# Patient Record
Sex: Female | Born: 1960 | Race: White | Hispanic: No | Smoking: Never smoker
Health system: Southern US, Community
[De-identification: ages and names within clinical notes are randomized; demographics above are authoritative.]

## PROBLEM LIST (undated history)

## (undated) DIAGNOSIS — I10 Essential (primary) hypertension: Secondary | ICD-10-CM

## (undated) DIAGNOSIS — M810 Age-related osteoporosis without current pathological fracture: Secondary | ICD-10-CM

## (undated) HISTORY — DX: Essential (primary) hypertension: I10

## (undated) HISTORY — DX: Age-related osteoporosis without current pathological fracture: M81.0

---

## 1997-05-31 ENCOUNTER — Ambulatory Visit (HOSPITAL_COMMUNITY): Admission: RE | Admit: 1997-05-31 | Discharge: 1997-05-31 | Payer: Self-pay | Admitting: Obstetrics and Gynecology

## 1998-05-22 ENCOUNTER — Other Ambulatory Visit: Admission: RE | Admit: 1998-05-22 | Discharge: 1998-05-22 | Payer: Self-pay | Admitting: Obstetrics and Gynecology

## 1998-06-02 ENCOUNTER — Ambulatory Visit (HOSPITAL_COMMUNITY): Admission: RE | Admit: 1998-06-02 | Discharge: 1998-06-02 | Payer: Self-pay | Admitting: Obstetrics and Gynecology

## 1998-06-02 ENCOUNTER — Encounter: Payer: Self-pay | Admitting: Obstetrics and Gynecology

## 1999-06-12 ENCOUNTER — Ambulatory Visit (HOSPITAL_COMMUNITY): Admission: RE | Admit: 1999-06-12 | Discharge: 1999-06-12 | Payer: Self-pay | Admitting: Obstetrics and Gynecology

## 1999-06-12 ENCOUNTER — Encounter: Payer: Self-pay | Admitting: Obstetrics and Gynecology

## 1999-07-04 ENCOUNTER — Other Ambulatory Visit: Admission: RE | Admit: 1999-07-04 | Discharge: 1999-07-04 | Payer: Self-pay | Admitting: Obstetrics and Gynecology

## 2000-06-30 ENCOUNTER — Encounter: Payer: Self-pay | Admitting: Obstetrics and Gynecology

## 2000-06-30 ENCOUNTER — Encounter: Admission: RE | Admit: 2000-06-30 | Discharge: 2000-06-30 | Payer: Self-pay | Admitting: Obstetrics and Gynecology

## 2000-07-29 ENCOUNTER — Other Ambulatory Visit: Admission: RE | Admit: 2000-07-29 | Discharge: 2000-07-29 | Payer: Self-pay | Admitting: Obstetrics and Gynecology

## 2001-10-26 ENCOUNTER — Other Ambulatory Visit: Admission: RE | Admit: 2001-10-26 | Discharge: 2001-10-26 | Payer: Self-pay | Admitting: Obstetrics and Gynecology

## 2003-02-22 ENCOUNTER — Encounter: Admission: RE | Admit: 2003-02-22 | Discharge: 2003-02-22 | Payer: Self-pay | Admitting: Obstetrics and Gynecology

## 2003-04-19 ENCOUNTER — Other Ambulatory Visit: Admission: RE | Admit: 2003-04-19 | Discharge: 2003-04-19 | Payer: Self-pay | Admitting: Obstetrics and Gynecology

## 2003-06-22 ENCOUNTER — Other Ambulatory Visit: Admission: RE | Admit: 2003-06-22 | Discharge: 2003-06-22 | Payer: Self-pay | Admitting: Obstetrics and Gynecology

## 2004-01-31 ENCOUNTER — Other Ambulatory Visit: Admission: RE | Admit: 2004-01-31 | Discharge: 2004-01-31 | Payer: Self-pay | Admitting: Obstetrics and Gynecology

## 2004-03-14 ENCOUNTER — Encounter: Admission: RE | Admit: 2004-03-14 | Discharge: 2004-03-14 | Payer: Self-pay | Admitting: Obstetrics and Gynecology

## 2004-06-26 ENCOUNTER — Other Ambulatory Visit: Admission: RE | Admit: 2004-06-26 | Discharge: 2004-06-26 | Payer: Self-pay | Admitting: Obstetrics and Gynecology

## 2004-12-03 ENCOUNTER — Other Ambulatory Visit: Admission: RE | Admit: 2004-12-03 | Discharge: 2004-12-03 | Payer: Self-pay | Admitting: Obstetrics and Gynecology

## 2005-03-15 ENCOUNTER — Encounter: Admission: RE | Admit: 2005-03-15 | Discharge: 2005-03-15 | Payer: Self-pay | Admitting: Obstetrics and Gynecology

## 2005-04-03 ENCOUNTER — Encounter: Admission: RE | Admit: 2005-04-03 | Discharge: 2005-04-03 | Payer: Self-pay | Admitting: Obstetrics and Gynecology

## 2005-06-18 ENCOUNTER — Other Ambulatory Visit: Admission: RE | Admit: 2005-06-18 | Discharge: 2005-06-18 | Payer: Self-pay | Admitting: Obstetrics and Gynecology

## 2005-07-01 ENCOUNTER — Encounter: Admission: RE | Admit: 2005-07-01 | Discharge: 2005-07-01 | Payer: Self-pay | Admitting: Obstetrics and Gynecology

## 2005-09-30 ENCOUNTER — Encounter: Admission: RE | Admit: 2005-09-30 | Discharge: 2005-09-30 | Payer: Self-pay | Admitting: Obstetrics and Gynecology

## 2006-03-18 ENCOUNTER — Encounter: Admission: RE | Admit: 2006-03-18 | Discharge: 2006-03-18 | Payer: Self-pay | Admitting: Obstetrics and Gynecology

## 2006-09-09 ENCOUNTER — Encounter: Admission: RE | Admit: 2006-09-09 | Discharge: 2006-09-09 | Payer: Self-pay | Admitting: Obstetrics and Gynecology

## 2007-04-03 ENCOUNTER — Encounter: Admission: RE | Admit: 2007-04-03 | Discharge: 2007-04-03 | Payer: Self-pay | Admitting: Obstetrics and Gynecology

## 2007-04-10 ENCOUNTER — Encounter: Admission: RE | Admit: 2007-04-10 | Discharge: 2007-04-10 | Payer: Self-pay | Admitting: Obstetrics and Gynecology

## 2008-04-07 ENCOUNTER — Encounter: Admission: RE | Admit: 2008-04-07 | Discharge: 2008-04-07 | Payer: Self-pay | Admitting: Obstetrics and Gynecology

## 2009-04-13 ENCOUNTER — Encounter: Admission: RE | Admit: 2009-04-13 | Discharge: 2009-04-13 | Payer: Self-pay | Admitting: Obstetrics and Gynecology

## 2010-03-24 ENCOUNTER — Encounter: Payer: Self-pay | Admitting: Obstetrics and Gynecology

## 2010-04-02 ENCOUNTER — Other Ambulatory Visit: Payer: Self-pay | Admitting: Obstetrics and Gynecology

## 2010-04-02 DIAGNOSIS — Z1239 Encounter for other screening for malignant neoplasm of breast: Secondary | ICD-10-CM

## 2010-04-19 ENCOUNTER — Ambulatory Visit
Admission: RE | Admit: 2010-04-19 | Discharge: 2010-04-19 | Disposition: A | Discharge status: Home / Self Care | Payer: PRIVATE HEALTH INSURANCE | Source: Ambulatory Visit | Attending: Obstetrics and Gynecology | Admitting: Obstetrics and Gynecology

## 2010-04-19 DIAGNOSIS — Z1239 Encounter for other screening for malignant neoplasm of breast: Secondary | ICD-10-CM

## 2011-04-22 ENCOUNTER — Other Ambulatory Visit: Payer: Self-pay | Admitting: Obstetrics and Gynecology

## 2011-04-22 DIAGNOSIS — Z1231 Encounter for screening mammogram for malignant neoplasm of breast: Secondary | ICD-10-CM

## 2011-04-24 ENCOUNTER — Ambulatory Visit
Admission: RE | Admit: 2011-04-24 | Discharge: 2011-04-24 | Disposition: A | Discharge status: Home / Self Care | Payer: BC Managed Care – PPO | Source: Ambulatory Visit | Attending: Obstetrics and Gynecology | Admitting: Obstetrics and Gynecology

## 2011-04-24 DIAGNOSIS — Z1231 Encounter for screening mammogram for malignant neoplasm of breast: Secondary | ICD-10-CM

## 2012-03-17 ENCOUNTER — Other Ambulatory Visit: Payer: Self-pay | Admitting: Obstetrics and Gynecology

## 2012-03-17 DIAGNOSIS — Z1231 Encounter for screening mammogram for malignant neoplasm of breast: Secondary | ICD-10-CM

## 2012-04-29 ENCOUNTER — Ambulatory Visit: Payer: BC Managed Care – PPO

## 2012-06-03 ENCOUNTER — Ambulatory Visit
Admission: RE | Admit: 2012-06-03 | Discharge: 2012-06-03 | Disposition: A | Discharge status: Home / Self Care | Payer: BC Managed Care – PPO | Source: Ambulatory Visit | Attending: Obstetrics and Gynecology | Admitting: Obstetrics and Gynecology

## 2012-06-03 DIAGNOSIS — Z1231 Encounter for screening mammogram for malignant neoplasm of breast: Secondary | ICD-10-CM

## 2013-06-21 ENCOUNTER — Other Ambulatory Visit: Payer: Self-pay

## 2013-06-21 DIAGNOSIS — Z1231 Encounter for screening mammogram for malignant neoplasm of breast: Secondary | ICD-10-CM

## 2013-07-14 ENCOUNTER — Ambulatory Visit
Admission: RE | Admit: 2013-07-14 | Discharge: 2013-07-14 | Disposition: A | Discharge status: Home / Self Care | Payer: BC Managed Care – PPO | Source: Ambulatory Visit

## 2013-07-14 ENCOUNTER — Encounter (INDEPENDENT_AMBULATORY_CARE_PROVIDER_SITE_OTHER): Payer: Self-pay

## 2013-07-14 DIAGNOSIS — Z1231 Encounter for screening mammogram for malignant neoplasm of breast: Secondary | ICD-10-CM

## 2014-04-12 ENCOUNTER — Other Ambulatory Visit: Payer: Self-pay

## 2014-04-12 DIAGNOSIS — Z1231 Encounter for screening mammogram for malignant neoplasm of breast: Secondary | ICD-10-CM

## 2014-07-20 ENCOUNTER — Ambulatory Visit
Admission: RE | Admit: 2014-07-20 | Discharge: 2014-07-20 | Disposition: A | Discharge status: Home / Self Care | Payer: BLUE CROSS/BLUE SHIELD | Source: Ambulatory Visit

## 2014-07-20 ENCOUNTER — Ambulatory Visit: Payer: Self-pay

## 2014-07-20 DIAGNOSIS — Z1231 Encounter for screening mammogram for malignant neoplasm of breast: Secondary | ICD-10-CM

## 2015-07-04 ENCOUNTER — Other Ambulatory Visit: Payer: Self-pay

## 2015-07-04 DIAGNOSIS — Z1231 Encounter for screening mammogram for malignant neoplasm of breast: Secondary | ICD-10-CM

## 2015-07-25 ENCOUNTER — Ambulatory Visit
Admission: RE | Admit: 2015-07-25 | Discharge: 2015-07-25 | Disposition: A | Discharge status: Home / Self Care | Payer: BLUE CROSS/BLUE SHIELD | Source: Ambulatory Visit

## 2015-07-25 DIAGNOSIS — Z1231 Encounter for screening mammogram for malignant neoplasm of breast: Secondary | ICD-10-CM

## 2015-08-16 ENCOUNTER — Ambulatory Visit: Payer: BLUE CROSS/BLUE SHIELD

## 2016-05-10 ENCOUNTER — Other Ambulatory Visit: Payer: Self-pay | Admitting: Obstetrics and Gynecology

## 2016-05-10 DIAGNOSIS — Z1231 Encounter for screening mammogram for malignant neoplasm of breast: Secondary | ICD-10-CM

## 2016-07-25 ENCOUNTER — Ambulatory Visit
Admission: RE | Admit: 2016-07-25 | Discharge: 2016-07-25 | Disposition: A | Discharge status: Home / Self Care | Payer: BLUE CROSS/BLUE SHIELD | Source: Ambulatory Visit | Attending: Obstetrics and Gynecology | Admitting: Obstetrics and Gynecology

## 2016-07-25 DIAGNOSIS — Z1231 Encounter for screening mammogram for malignant neoplasm of breast: Secondary | ICD-10-CM

## 2017-06-12 ENCOUNTER — Other Ambulatory Visit: Payer: Self-pay | Admitting: Obstetrics and Gynecology

## 2017-06-12 DIAGNOSIS — Z139 Encounter for screening, unspecified: Secondary | ICD-10-CM

## 2017-07-16 ENCOUNTER — Other Ambulatory Visit: Payer: Self-pay | Admitting: Obstetrics and Gynecology

## 2017-07-16 DIAGNOSIS — Z6831 Body mass index (BMI) 31.0-31.9, adult: Secondary | ICD-10-CM | POA: Diagnosis not present

## 2017-07-16 DIAGNOSIS — N63 Unspecified lump in unspecified breast: Secondary | ICD-10-CM

## 2017-07-16 DIAGNOSIS — Z01419 Encounter for gynecological examination (general) (routine) without abnormal findings: Secondary | ICD-10-CM | POA: Diagnosis not present

## 2017-07-18 ENCOUNTER — Ambulatory Visit
Admission: RE | Admit: 2017-07-18 | Discharge: 2017-07-18 | Disposition: A | Discharge status: Home / Self Care | Payer: BLUE CROSS/BLUE SHIELD | Source: Ambulatory Visit | Attending: Obstetrics and Gynecology | Admitting: Obstetrics and Gynecology

## 2017-07-18 ENCOUNTER — Other Ambulatory Visit: Payer: Self-pay | Admitting: Obstetrics and Gynecology

## 2017-07-18 DIAGNOSIS — N63 Unspecified lump in unspecified breast: Secondary | ICD-10-CM

## 2017-07-18 DIAGNOSIS — R928 Other abnormal and inconclusive findings on diagnostic imaging of breast: Secondary | ICD-10-CM | POA: Diagnosis not present

## 2017-07-18 DIAGNOSIS — N6489 Other specified disorders of breast: Secondary | ICD-10-CM | POA: Diagnosis not present

## 2017-07-31 ENCOUNTER — Ambulatory Visit: Payer: BLUE CROSS/BLUE SHIELD

## 2018-06-26 ENCOUNTER — Other Ambulatory Visit: Payer: Self-pay | Admitting: Obstetrics and Gynecology

## 2018-06-26 DIAGNOSIS — Z1231 Encounter for screening mammogram for malignant neoplasm of breast: Secondary | ICD-10-CM

## 2018-08-26 ENCOUNTER — Other Ambulatory Visit: Payer: Self-pay

## 2018-08-26 ENCOUNTER — Ambulatory Visit
Admission: RE | Admit: 2018-08-26 | Discharge: 2018-08-26 | Disposition: A | Discharge status: Home / Self Care | Payer: BC Managed Care – PPO | Source: Ambulatory Visit | Attending: Obstetrics and Gynecology | Admitting: Obstetrics and Gynecology

## 2018-08-26 DIAGNOSIS — Z1231 Encounter for screening mammogram for malignant neoplasm of breast: Secondary | ICD-10-CM

## 2018-09-01 DIAGNOSIS — I10 Essential (primary) hypertension: Secondary | ICD-10-CM | POA: Diagnosis not present

## 2018-09-01 DIAGNOSIS — N952 Postmenopausal atrophic vaginitis: Secondary | ICD-10-CM | POA: Diagnosis not present

## 2018-09-01 DIAGNOSIS — Z01419 Encounter for gynecological examination (general) (routine) without abnormal findings: Secondary | ICD-10-CM | POA: Diagnosis not present

## 2018-09-01 DIAGNOSIS — Z01411 Encounter for gynecological examination (general) (routine) with abnormal findings: Secondary | ICD-10-CM | POA: Diagnosis not present

## 2018-09-01 DIAGNOSIS — M810 Age-related osteoporosis without current pathological fracture: Secondary | ICD-10-CM | POA: Diagnosis not present

## 2018-09-02 ENCOUNTER — Telehealth: Payer: Self-pay | Admitting: Obstetrics and Gynecology

## 2018-09-02 NOTE — Telephone Encounter (Signed)
Is she wants me to be PCP, then please set up 63min OV.  However, I am going to be out of town in the near future and that may delay the appointment.  If she needs attention earlier, then another doc may be able to help out (at least to address the BP issue).  Thanks.

## 2018-09-02 NOTE — Telephone Encounter (Signed)
Best number [phone] Pt called wanting to set up new pt appointment with dr Diona Browner or dr Eldridge Scot belton [birth year]  dad Richie Belton  06/03/63     brother Both see dr Damita Dunnings   Mom see dr Thedore Mins belton [birth year]04[birth year]41    Pt has bp concerns She saw dr Gaetano Net yesterday bp was 160/90 She stated this has been like with for a few months  Offered dr cody Tuesday appointment

## 2018-09-03 NOTE — Telephone Encounter (Signed)
I can see in next available appt slot next week. If BP goes above 160/90... should go to urgent care earlier.

## 2018-09-03 NOTE — Telephone Encounter (Signed)
I left msg for pt to call back

## 2018-09-03 NOTE — Telephone Encounter (Signed)
Pt called back and scheduled 7/27

## 2018-09-08 ENCOUNTER — Ambulatory Visit: Payer: BC Managed Care – PPO | Admitting: Family Medicine

## 2018-09-28 ENCOUNTER — Encounter: Payer: Self-pay | Admitting: Family Medicine

## 2018-09-28 ENCOUNTER — Ambulatory Visit: Payer: BC Managed Care – PPO | Admitting: Family Medicine

## 2018-09-28 ENCOUNTER — Other Ambulatory Visit: Payer: Self-pay

## 2018-09-28 VITALS — BP 164/96 | HR 98 | Temp 98.2°F | Ht 62.5 in | Wt 163.1 lb

## 2018-09-28 DIAGNOSIS — Z7189 Other specified counseling: Secondary | ICD-10-CM

## 2018-09-28 DIAGNOSIS — Z0000 Encounter for general adult medical examination without abnormal findings: Secondary | ICD-10-CM

## 2018-09-28 DIAGNOSIS — I10 Essential (primary) hypertension: Secondary | ICD-10-CM

## 2018-09-28 MED ORDER — AMLODIPINE BESYLATE 2.5 MG PO TABS: 2.5000 mg | ORAL_TABLET | Freq: Every day | ORAL | 3 refills | Status: DC

## 2018-09-28 NOTE — Patient Instructions (Addendum)
Keep trying to cut back on salt and keep walking.   Look up to [place] or check [email or web address].   Take care.  Glad to see you.  Ask about a fasting lab appointment.  I would get a flu shot each fall.

## 2018-09-29 ENCOUNTER — Other Ambulatory Visit (INDEPENDENT_AMBULATORY_CARE_PROVIDER_SITE_OTHER): Payer: BC Managed Care – PPO

## 2018-09-29 DIAGNOSIS — I10 Essential (primary) hypertension: Secondary | ICD-10-CM | POA: Diagnosis not present

## 2018-09-29 LAB — COMPREHENSIVE METABOLIC PANEL
ALT: 15 U/L (ref 0–35)
AST: 18 U/L (ref 0–37)
Albumin: 4.5 g/dL (ref 3.5–5.2)
Alkaline Phosphatase: 52 U/L (ref 39–117)
BUN: 15 mg/dL (ref 6–23)
CO2: 30 mEq/L (ref 19–32)
Calcium: 10 mg/dL (ref 8.4–10.5)
Chloride: 102 mEq/L (ref 96–112)
Creatinine, Ser: 0.82 mg/dL (ref 0.40–1.20)
GFR: 71.61 mL/min (ref >60.00)
Glucose, Bld: 94 mg/dL (ref 70–99)
Potassium: 4.1 mEq/L (ref 3.5–5.1)
Sodium: 139 mEq/L (ref 135–145)
Total Bilirubin: 0.7 mg/dL (ref 0.2–1.2)
Total Protein: 7 g/dL (ref 6.0–8.3)

## 2018-09-29 LAB — LIPID PANEL
Cholesterol: 220 mg/dL — ABNORMAL HIGH (ref 0–200)
HDL: 55.5 mg/dL (ref >39.00)
LDL Cholesterol: 129 mg/dL — ABNORMAL HIGH (ref 0–99)
NonHDL: 164.62
Total CHOL/HDL Ratio: 4
Triglycerides: 176 mg/dL — ABNORMAL HIGH (ref 0.0–149.0)
VLDL: 35.2 mg/dL (ref 0.0–40.0)

## 2018-10-01 ENCOUNTER — Encounter: Payer: Self-pay | Admitting: Family Medicine

## 2018-10-01 DIAGNOSIS — I10 Essential (primary) hypertension: Secondary | ICD-10-CM | POA: Insufficient documentation

## 2018-10-01 DIAGNOSIS — Z7189 Other specified counseling: Secondary | ICD-10-CM | POA: Insufficient documentation

## 2018-10-01 DIAGNOSIS — Z0000 Encounter for general adult medical examination without abnormal findings: Secondary | ICD-10-CM | POA: Insufficient documentation

## 2018-10-01 NOTE — Progress Notes (Signed)
New patient.  CPE- See plan.  Routine anticipatory guidance given to patient.  See health maintenance.    Tetanus discussed with patient.  Unclear about previous date.  Encouraged. Flu shot encouraged for the follow-up Shingles shot due at 18. Pneumonia shot due at 20 Living will discussed with patient.  Daughters Mel Almond and Benjie Karvonen equally designated if patient were incapacitated. Mammogram and Pap smear through gynecology. Bone density test through gynecology. Colon cancer screening follow-up through Dr. Earlean Shawl pending.  I will defer.  She agrees.  Hypertension:    No meds.  Blood pressure elevated on multiple checks Chest pain with exertion:no Edema:no Short of breath:no She will return for labs. She is walking for exercise.  Discussed low-salt diet.  PMH and SH reviewed  Meds, vitals, and allergies reviewed.   ROS: Per HPI.  Unless specifically indicated otherwise in HPI, the patient denies:  General: fever. Eyes: acute vision changes ENT: sore throat Cardiovascular: chest pain Respiratory: SOB GI: vomiting GU: dysuria Musculoskeletal: acute back pain Derm: acute rash Neuro: acute motor dysfunction Psych: worsening mood Endocrine: polydipsia Heme: bleeding Allergy: hayfever  GEN: nad, alert and oriented HEENT: ncat NECK: supple w/o LA CV: rrr. PULM: ctab, no inc wob ABD: soft, +bs EXT: no edema SKIN: no acute rash

## 2018-10-01 NOTE — Assessment & Plan Note (Signed)
Living will discussed with patient.  Daughters Bailey and Blair equally designated if patient were incapacitated. 

## 2018-10-01 NOTE — Assessment & Plan Note (Addendum)
Tetanus discussed with patient.  Unclear about previous date.  Encouraged. Flu shot encouraged for the follow-up Shingles shot due at 29. Pneumonia shot due at 45 Living will discussed with patient.  Daughters Mel Almond and Benjie Karvonen equally designated if patient were incapacitated. Mammogram and Pap smear through gynecology. Bone density test through gynecology. Colon cancer screening follow-up through Dr. Earlean Shawl pending.  I will defer.  She agrees.

## 2018-10-01 NOTE — Assessment & Plan Note (Signed)
Discussed diet and exercise.  Start amlodipine 2.5 mg a day.  Return for follow-up labs.  We may need to adjust her blood pressure medication in the future.  She can update me about her pressure.  Routine cautions given.  She agrees.

## 2018-10-08 ENCOUNTER — Other Ambulatory Visit: Payer: Self-pay | Admitting: Family Medicine

## 2018-10-08 DIAGNOSIS — E785 Hyperlipidemia, unspecified: Secondary | ICD-10-CM

## 2018-10-08 DIAGNOSIS — I10 Essential (primary) hypertension: Secondary | ICD-10-CM

## 2018-11-12 LAB — HM COLONOSCOPY

## 2018-11-19 DIAGNOSIS — Z1211 Encounter for screening for malignant neoplasm of colon: Secondary | ICD-10-CM | POA: Diagnosis not present

## 2018-12-10 ENCOUNTER — Telehealth: Payer: Self-pay

## 2018-12-10 NOTE — Telephone Encounter (Signed)
Called pt in regards to lab work (screening for HCV and HIV), which pt refused.  I also informed pt of immunizations due, (Flu & TDap).  Pt stated that she will call the office to schedule a nurse visit.

## 2018-12-11 NOTE — Telephone Encounter (Signed)
Noted. Thanks.

## 2019-04-13 ENCOUNTER — Other Ambulatory Visit: Payer: BC Managed Care – PPO

## 2019-05-12 ENCOUNTER — Other Ambulatory Visit: Payer: BC Managed Care – PPO

## 2019-07-07 ENCOUNTER — Other Ambulatory Visit: Payer: Self-pay | Admitting: Obstetrics and Gynecology

## 2019-07-07 ENCOUNTER — Other Ambulatory Visit (INDEPENDENT_AMBULATORY_CARE_PROVIDER_SITE_OTHER): Payer: BC Managed Care – PPO

## 2019-07-07 ENCOUNTER — Other Ambulatory Visit: Payer: Self-pay

## 2019-07-07 DIAGNOSIS — Z1231 Encounter for screening mammogram for malignant neoplasm of breast: Secondary | ICD-10-CM

## 2019-07-07 DIAGNOSIS — I10 Essential (primary) hypertension: Secondary | ICD-10-CM | POA: Diagnosis not present

## 2019-07-07 LAB — LIPID PANEL
Cholesterol: 212 mg/dL — ABNORMAL HIGH (ref 0–200)
HDL: 53.6 mg/dL (ref >39.00)
LDL Cholesterol: 122 mg/dL — ABNORMAL HIGH (ref 0–99)
NonHDL: 157.9
Total CHOL/HDL Ratio: 4
Triglycerides: 179 mg/dL — ABNORMAL HIGH (ref 0.0–149.0)
VLDL: 35.8 mg/dL (ref 0.0–40.0)

## 2019-09-01 ENCOUNTER — Other Ambulatory Visit: Payer: Self-pay

## 2019-09-01 ENCOUNTER — Ambulatory Visit
Admission: RE | Admit: 2019-09-01 | Discharge: 2019-09-01 | Disposition: A | Discharge status: Home / Self Care | Payer: BC Managed Care – PPO | Source: Ambulatory Visit | Attending: Obstetrics and Gynecology | Admitting: Obstetrics and Gynecology

## 2019-09-01 DIAGNOSIS — Z1231 Encounter for screening mammogram for malignant neoplasm of breast: Secondary | ICD-10-CM

## 2019-09-11 ENCOUNTER — Other Ambulatory Visit: Payer: Self-pay | Admitting: Family Medicine

## 2019-09-15 DIAGNOSIS — Z01419 Encounter for gynecological examination (general) (routine) without abnormal findings: Secondary | ICD-10-CM | POA: Diagnosis not present

## 2019-09-15 DIAGNOSIS — Z6830 Body mass index (BMI) 30.0-30.9, adult: Secondary | ICD-10-CM | POA: Diagnosis not present

## 2019-09-15 LAB — HM PAP SMEAR

## 2019-10-07 ENCOUNTER — Encounter: Payer: BC Managed Care – PPO | Admitting: Family Medicine

## 2019-10-12 ENCOUNTER — Telehealth: Payer: Self-pay

## 2019-10-12 NOTE — Telephone Encounter (Signed)
Pt needs a referral to [facility] for a DEXA. They told her she needed referral before she could schedule.

## 2019-10-14 NOTE — Telephone Encounter (Signed)
I do not see where I will talk to the patient recently about this.  I thought it was previously handled by gynecology.  Are they addressing this?  Has she contacted gynecology?

## 2019-10-14 NOTE — Telephone Encounter (Signed)
Patient says she does not like her GYN office and prefers not to have the DXA done there.  She prefers to go to [facility] because they do her mammograms.  However, patient does not know if the GYN office set up her DXA or not and will check with them first and will call back if that doesn't work out.

## 2019-10-15 ENCOUNTER — Other Ambulatory Visit: Payer: Self-pay | Admitting: Obstetrics and Gynecology

## 2019-10-15 DIAGNOSIS — M810 Age-related osteoporosis without current pathological fracture: Secondary | ICD-10-CM

## 2019-10-19 ENCOUNTER — Encounter: Payer: BC Managed Care – PPO | Admitting: Family Medicine

## 2019-10-26 ENCOUNTER — Ambulatory Visit (INDEPENDENT_AMBULATORY_CARE_PROVIDER_SITE_OTHER): Payer: BC Managed Care – PPO | Admitting: Family Medicine

## 2019-10-26 ENCOUNTER — Encounter: Payer: Self-pay | Admitting: Family Medicine

## 2019-10-26 ENCOUNTER — Other Ambulatory Visit: Payer: Self-pay

## 2019-10-26 VITALS — BP 120/78 | HR 82 | Temp 97.3°F | Ht 62.5 in | Wt 165.2 lb

## 2019-10-26 DIAGNOSIS — I10 Essential (primary) hypertension: Secondary | ICD-10-CM

## 2019-10-26 DIAGNOSIS — Z0000 Encounter for general adult medical examination without abnormal findings: Secondary | ICD-10-CM | POA: Diagnosis not present

## 2019-10-26 DIAGNOSIS — Z7189 Other specified counseling: Secondary | ICD-10-CM

## 2019-10-26 MED ORDER — AMLODIPINE BESYLATE 2.5 MG PO TABS: 2.5000 mg | ORAL_TABLET | Freq: Every day | ORAL | 3 refills | Status: DC

## 2019-10-26 NOTE — Progress Notes (Signed)
This visit occurred during the SARS-CoV-2 public health emergency.  Safety protocols were in place, including screening questions prior to the visit, additional usage of staff PPE, and extensive cleaning of exam room while observing appropriate contact time as indicated for disinfecting solutions.  CPE- See plan.  Routine anticipatory guidance given to patient.  See health maintenance.  The possibility exists that previously documented standard health maintenance information may have been brought forward from a previous encounter into this note.  If needed, that same information has been updated to reflect the current situation based on today's encounter.    Hypertension:    Using medication without problems or lightheadedness: yes Chest pain with exertion:no Edema:no Short of breath:no Average home BPs:110s/70s at home.   She is walking for exercise.    Tetanus discussed with patient.  Unclear about previous date.  Encouraged. Flu shot encouraged for the fall  covid vaccine d/w pt. encouraged. Shingles shot due at 60. Pneumonia shot due at 81 Living will discussed with patient.  Daughters Fredric Mare and Carlena Sax equally designated if patient were incapacitated. Mammogram and Pap smear through gynecology. Bone density test through gynecology. Colon cancer screening through Dr. Kinnie Scales 2020  PMH and SH reviewed  Meds, vitals, and allergies reviewed.   ROS: Per HPI.  Unless specifically indicated otherwise in HPI, the patient denies:  General: fever. Eyes: acute vision changes ENT: sore throat Cardiovascular: chest pain Respiratory: SOB GI: vomiting GU: dysuria Musculoskeletal: acute back pain Derm: acute rash Neuro: acute motor dysfunction Psych: worsening mood Endocrine: polydipsia Heme: bleeding Allergy: hayfever  GEN: nad, alert and oriented HEENT: ncat NECK: supple w/o LA CV: rrr. PULM: ctab, no inc wob ABD: soft, +bs EXT: no edema SKIN: no acute rash  Recheck BP  120/78 Initial 144/90  The 10-year ASCVD risk score Denman George DC Jr., et al., 2013) is: 3.8%   Values used to calculate the score:     Age: 59 years     Sex: Female     Is Non-Hispanic African American: No     Diabetic: No     Tobacco smoker: No     Systolic Blood Pressure: 120 mmHg     Is BP treated: Yes     HDL Cholesterol: 53.6 mg/dL     Total Cholesterol: 212 mg/dL

## 2019-10-26 NOTE — Patient Instructions (Signed)
Keep working on diet and exercise.  Continue amlodipine.  Update me as needed.  Recheck next year.  Take care.  Glad to see you.

## 2019-10-31 NOTE — Assessment & Plan Note (Addendum)
Controlled.  Continue work on diet and exercise.  Continue amlodipine.  Labs discussed with patient.

## 2019-10-31 NOTE — Assessment & Plan Note (Signed)
Living will discussed with patient.  Daughters Bailey and Blair equally designated if patient were incapacitated. 

## 2019-10-31 NOTE — Assessment & Plan Note (Signed)
Tetanus discussed with patient.  Unclear about previous date.  Encouraged. Flu shot encouraged for the fall  covid vaccine d/w pt. encouraged. Shingles shot due at 60. Pneumonia shot due at 29 Living will discussed with patient.  Daughters Fredric Mare and Carlena Sax equally designated if patient were incapacitated. Mammogram and Pap smear through gynecology. Bone density test through gynecology. Colon cancer screening through Dr. Kinnie Scales 2020

## 2020-02-04 ENCOUNTER — Other Ambulatory Visit: Payer: BC Managed Care – PPO

## 2020-05-09 ENCOUNTER — Other Ambulatory Visit: Payer: Self-pay | Admitting: Obstetrics and Gynecology

## 2020-05-09 DIAGNOSIS — M810 Age-related osteoporosis without current pathological fracture: Secondary | ICD-10-CM

## 2020-05-11 ENCOUNTER — Other Ambulatory Visit: Payer: BC Managed Care – PPO

## 2020-06-06 DIAGNOSIS — D2271 Melanocytic nevi of right lower limb, including hip: Secondary | ICD-10-CM | POA: Diagnosis not present

## 2020-06-06 DIAGNOSIS — D2262 Melanocytic nevi of left upper limb, including shoulder: Secondary | ICD-10-CM | POA: Diagnosis not present

## 2020-06-06 DIAGNOSIS — D2261 Melanocytic nevi of right upper limb, including shoulder: Secondary | ICD-10-CM | POA: Diagnosis not present

## 2020-06-06 DIAGNOSIS — D225 Melanocytic nevi of trunk: Secondary | ICD-10-CM | POA: Diagnosis not present

## 2020-07-12 ENCOUNTER — Other Ambulatory Visit: Payer: Self-pay | Admitting: Obstetrics and Gynecology

## 2020-07-12 DIAGNOSIS — Z1231 Encounter for screening mammogram for malignant neoplasm of breast: Secondary | ICD-10-CM

## 2020-09-07 ENCOUNTER — Other Ambulatory Visit: Payer: Self-pay

## 2020-09-07 ENCOUNTER — Ambulatory Visit
Admission: RE | Admit: 2020-09-07 | Discharge: 2020-09-07 | Disposition: A | Discharge status: Home / Self Care | Payer: BC Managed Care – PPO | Source: Ambulatory Visit | Attending: Obstetrics and Gynecology | Admitting: Obstetrics and Gynecology

## 2020-09-07 DIAGNOSIS — Z1231 Encounter for screening mammogram for malignant neoplasm of breast: Secondary | ICD-10-CM | POA: Diagnosis not present

## 2020-09-22 DIAGNOSIS — Z01419 Encounter for gynecological examination (general) (routine) without abnormal findings: Secondary | ICD-10-CM | POA: Diagnosis not present

## 2020-09-22 DIAGNOSIS — M810 Age-related osteoporosis without current pathological fracture: Secondary | ICD-10-CM | POA: Diagnosis not present

## 2020-09-22 DIAGNOSIS — Z6830 Body mass index (BMI) 30.0-30.9, adult: Secondary | ICD-10-CM | POA: Diagnosis not present

## 2020-09-22 DIAGNOSIS — N952 Postmenopausal atrophic vaginitis: Secondary | ICD-10-CM | POA: Diagnosis not present

## 2020-10-24 ENCOUNTER — Ambulatory Visit
Admission: RE | Admit: 2020-10-24 | Discharge: 2020-10-24 | Disposition: A | Discharge status: Home / Self Care | Payer: BC Managed Care – PPO | Source: Ambulatory Visit | Attending: Obstetrics and Gynecology | Admitting: Obstetrics and Gynecology

## 2020-10-24 ENCOUNTER — Other Ambulatory Visit: Payer: Self-pay

## 2020-10-24 DIAGNOSIS — M810 Age-related osteoporosis without current pathological fracture: Secondary | ICD-10-CM

## 2020-10-24 DIAGNOSIS — M8589 Other specified disorders of bone density and structure, multiple sites: Secondary | ICD-10-CM | POA: Diagnosis not present

## 2020-10-24 DIAGNOSIS — Z780 Asymptomatic menopausal state: Secondary | ICD-10-CM | POA: Diagnosis not present

## 2020-10-25 ENCOUNTER — Encounter: Payer: Self-pay | Admitting: Family Medicine

## 2020-10-25 DIAGNOSIS — M810 Age-related osteoporosis without current pathological fracture: Secondary | ICD-10-CM | POA: Insufficient documentation

## 2020-11-03 ENCOUNTER — Encounter: Payer: BC Managed Care – PPO | Admitting: Family Medicine

## 2020-11-09 ENCOUNTER — Other Ambulatory Visit (INDEPENDENT_AMBULATORY_CARE_PROVIDER_SITE_OTHER): Payer: BC Managed Care – PPO

## 2020-11-09 ENCOUNTER — Other Ambulatory Visit: Payer: Self-pay

## 2020-11-09 ENCOUNTER — Other Ambulatory Visit: Payer: Self-pay | Admitting: Family Medicine

## 2020-11-09 DIAGNOSIS — M810 Age-related osteoporosis without current pathological fracture: Secondary | ICD-10-CM | POA: Diagnosis not present

## 2020-11-09 DIAGNOSIS — I10 Essential (primary) hypertension: Secondary | ICD-10-CM

## 2020-11-09 LAB — COMPREHENSIVE METABOLIC PANEL
ALT: 16 U/L (ref 0–35)
AST: 18 U/L (ref 0–37)
Albumin: 4.3 g/dL (ref 3.5–5.2)
Alkaline Phosphatase: 47 U/L (ref 39–117)
BUN: 14 mg/dL (ref 6–23)
CO2: 29 mEq/L (ref 19–32)
Calcium: 10 mg/dL (ref 8.4–10.5)
Chloride: 103 mEq/L (ref 96–112)
Creatinine, Ser: 0.8 mg/dL (ref 0.40–1.20)
GFR: 80.28 mL/min (ref >60.00)
Glucose, Bld: 84 mg/dL (ref 70–99)
Potassium: 4.1 mEq/L (ref 3.5–5.1)
Sodium: 141 mEq/L (ref 135–145)
Total Bilirubin: 0.6 mg/dL (ref 0.2–1.2)
Total Protein: 6.9 g/dL (ref 6.0–8.3)

## 2020-11-09 LAB — LIPID PANEL
Cholesterol: 215 mg/dL — ABNORMAL HIGH (ref 0–200)
HDL: 50 mg/dL (ref >39.00)
NonHDL: 164.98
Total CHOL/HDL Ratio: 4
Triglycerides: 228 mg/dL — ABNORMAL HIGH (ref 0.0–149.0)
VLDL: 45.6 mg/dL — ABNORMAL HIGH (ref 0.0–40.0)

## 2020-11-09 LAB — LDL CHOLESTEROL, DIRECT: Direct LDL: 134 mg/dL

## 2020-11-09 LAB — VITAMIN D 25 HYDROXY (VIT D DEFICIENCY, FRACTURES): VITD: 44.42 ng/mL (ref 30.00–100.00)

## 2020-11-13 ENCOUNTER — Ambulatory Visit (INDEPENDENT_AMBULATORY_CARE_PROVIDER_SITE_OTHER): Payer: BC Managed Care – PPO | Admitting: Family Medicine

## 2020-11-13 ENCOUNTER — Other Ambulatory Visit: Payer: Self-pay

## 2020-11-13 ENCOUNTER — Encounter: Payer: Self-pay | Admitting: Family Medicine

## 2020-11-13 VITALS — BP 164/98 | HR 93 | Temp 98.0°F | Ht 62.5 in | Wt 163.0 lb

## 2020-11-13 DIAGNOSIS — I10 Essential (primary) hypertension: Secondary | ICD-10-CM

## 2020-11-13 DIAGNOSIS — Z7189 Other specified counseling: Secondary | ICD-10-CM

## 2020-11-13 DIAGNOSIS — Z0000 Encounter for general adult medical examination without abnormal findings: Secondary | ICD-10-CM

## 2020-11-13 DIAGNOSIS — M810 Age-related osteoporosis without current pathological fracture: Secondary | ICD-10-CM

## 2020-11-13 MED ORDER — AMLODIPINE BESYLATE 2.5 MG PO TABS: 2.5000 mg | ORAL_TABLET | Freq: Every day | ORAL | 3 refills | Status: DC

## 2020-11-13 NOTE — Patient Instructions (Addendum)
Keep taking 1000 units vitamin D a day and 1200mg  of calcium a day.   I would start taking fosamax/alendronate- please consider that.   Weight bearing exercise.   I would get vaccinated this fall.  Take care.  Glad to see you.

## 2020-11-13 NOTE — Progress Notes (Signed)
This visit occurred during the SARS-CoV-2 public health emergency.  Safety protocols were in place, including screening questions prior to the visit, additional usage of staff PPE, and extensive cleaning of exam room while observing appropriate contact time as indicated for disinfecting solutions.  CPE- See plan.  Routine anticipatory guidance given to patient.  See health maintenance.  The possibility exists that previously documented standard health maintenance information may have been brought forward from a previous encounter into this note.  If needed, that same information has been updated to reflect the current situation based on today's encounter.    Tetanus discussed with patient.  Unclear about previous date.  Encouraged. Flu shot encouraged for the fall  covid vaccine d/w pt. encouraged. Shingles shot due at 60. Pneumonia shot due at 79 Living will discussed with patient.  Daughters Fredric Mare and Carlena Sax equally designated if patient were incapacitated. Mammogram and Pap smear through gynecology. Bone density test through gynecology. Colon cancer screening through Dr. Kinnie Scales 2020  Osteoporosis.  T score -4.  Vit D wnl.  She was worried about her results d/w pt.  See plan.   Hypertension:    Using medication without problems or lightheadedness: yes Chest pain with exertion:no Edema:no Short of breath:no Average home BPs: usually 120/80s or better on home check.    PMH and SH reviewed  Meds, vitals, and allergies reviewed.   ROS: Per HPI.  Unless specifically indicated otherwise in HPI, the patient denies:  General: fever. Eyes: acute vision changes ENT: sore throat Cardiovascular: chest pain Respiratory: SOB GI: vomiting GU: dysuria Musculoskeletal: acute back pain Derm: acute rash Neuro: acute motor dysfunction Psych: worsening mood Endocrine: polydipsia Heme: bleeding Allergy: hayfever  GEN: nad, alert and oriented HEENT: ncat NECK: supple w/o LA CV: rrr. PULM:  ctab, no inc wob ABD: soft, +bs EXT: no edema SKIN: well perfused.

## 2020-11-15 NOTE — Assessment & Plan Note (Signed)
Continue amlodipine.  Average home BPs: usually 120/80s or better on home check.

## 2020-11-15 NOTE — Assessment & Plan Note (Signed)
D/w pt about DXA results and osteoporosis path/phys in general, including vit D and calcium.  Reasonable to treat with bisphosphonate, ie fosamax.  D/w pt about risk benefit, especially GI sx, jaw and long bone pathology.   She'll consider and update me as needed.    D/w pt that fosamax is a reasonable first line agent and to continue calcium and vit D and weight bearing exercise.  D/w pt that I have no expectation of her improving her bone density w/o rx treatment.  Also discussed that it would take years of treatment with fosamax (if tolerated) to see benefit but she could go ahead with routine vaccination as recommended and benefit from that sooner rather than later.  She will consider.

## 2020-11-15 NOTE — Assessment & Plan Note (Signed)
Living will discussed with patient.  Daughters Bailey and Blair equally designated if patient were incapacitated. 

## 2020-11-15 NOTE — Assessment & Plan Note (Signed)
Tetanus discussed with patient.  Unclear about previous date.  Encouraged. Flu shot encouraged for the fall  covid vaccine d/w pt. encouraged. Shingles shot due at 60. Pneumonia shot due at 7 Living will discussed with patient.  Daughters Fredric Mare and Carlena Sax equally designated if patient were incapacitated. Mammogram and Pap smear through gynecology. Bone density test through gynecology. Colon cancer screening through Dr. Kinnie Scales 2020

## 2020-11-22 ENCOUNTER — Telehealth: Payer: Self-pay | Admitting: Family Medicine

## 2020-11-22 DIAGNOSIS — M810 Age-related osteoporosis without current pathological fracture: Secondary | ICD-10-CM

## 2020-11-22 NOTE — Telephone Encounter (Signed)
I put in the referral and I'll defer to her.  Please explain to her that there are many docs (including me) who could address this locally.

## 2020-11-22 NOTE — Telephone Encounter (Signed)
Pt called in requesting a referral to [facility] Fax [phone]. World like a call back [phone]

## 2020-11-22 NOTE — Telephone Encounter (Signed)
Spoke to pt. She would like to go to endo for Osteoporosis. Wants to discuss the medications available.

## 2020-11-23 NOTE — Telephone Encounter (Signed)
Patient notified referral was done and advised patient that this could be tx by PCP or someone else more locally but this was patients preference.

## 2020-12-05 NOTE — Telephone Encounter (Addendum)
Pt called stating that they haven't received the referral and would like for it to be resend today if possible. If any question call [phone] opt 3

## 2020-12-07 NOTE — Telephone Encounter (Signed)
Faxed to [phone]    [id number]  Caryl Bis on 12/07/2020 [id number] - delivered at 12/07/2020 0806  Faxed again and I have made pt aware of this//ELEA

## 2021-01-11 ENCOUNTER — Telehealth: Payer: Self-pay | Admitting: Family Medicine

## 2021-01-11 NOTE — Telephone Encounter (Signed)
Opened in error

## 2021-06-13 DIAGNOSIS — D225 Melanocytic nevi of trunk: Secondary | ICD-10-CM | POA: Diagnosis not present

## 2021-06-13 DIAGNOSIS — D2262 Melanocytic nevi of left upper limb, including shoulder: Secondary | ICD-10-CM | POA: Diagnosis not present

## 2021-06-13 DIAGNOSIS — D2272 Melanocytic nevi of left lower limb, including hip: Secondary | ICD-10-CM | POA: Diagnosis not present

## 2021-06-13 DIAGNOSIS — D2261 Melanocytic nevi of right upper limb, including shoulder: Secondary | ICD-10-CM | POA: Diagnosis not present

## 2021-07-03 DIAGNOSIS — M810 Age-related osteoporosis without current pathological fracture: Secondary | ICD-10-CM | POA: Diagnosis not present

## 2021-07-03 DIAGNOSIS — Z780 Asymptomatic menopausal state: Secondary | ICD-10-CM | POA: Diagnosis not present

## 2021-07-03 DIAGNOSIS — Z8262 Family history of osteoporosis: Secondary | ICD-10-CM | POA: Diagnosis not present

## 2021-07-03 DIAGNOSIS — M8589 Other specified disorders of bone density and structure, multiple sites: Secondary | ICD-10-CM | POA: Diagnosis not present

## 2021-08-03 ENCOUNTER — Other Ambulatory Visit: Payer: Self-pay | Admitting: Obstetrics and Gynecology

## 2021-08-03 DIAGNOSIS — Z1231 Encounter for screening mammogram for malignant neoplasm of breast: Secondary | ICD-10-CM

## 2021-09-11 ENCOUNTER — Ambulatory Visit
Admission: RE | Admit: 2021-09-11 | Discharge: 2021-09-11 | Disposition: A | Discharge status: Home / Self Care | Payer: BC Managed Care – PPO | Source: Ambulatory Visit | Attending: Obstetrics and Gynecology | Admitting: Obstetrics and Gynecology

## 2021-09-11 DIAGNOSIS — Z1231 Encounter for screening mammogram for malignant neoplasm of breast: Secondary | ICD-10-CM | POA: Diagnosis not present

## 2021-11-15 ENCOUNTER — Telehealth: Payer: Self-pay | Admitting: Family Medicine

## 2021-11-15 MED ORDER — AMLODIPINE BESYLATE 2.5 MG PO TABS: 2.5000 mg | ORAL_TABLET | Freq: Every day | ORAL | 3 refills | Status: DC

## 2021-11-15 NOTE — Telephone Encounter (Signed)
Refill request for Amlodipine 2.5 mg tabs  LOV - 11/13/20 Next OV - 12/04/21 Last refill - 11/13/20 #90/3

## 2021-11-15 NOTE — Telephone Encounter (Signed)
Sent. Thanks.   

## 2021-11-15 NOTE — Telephone Encounter (Signed)
  Encourage patient to contact the pharmacy for refills or they can request refills through [facility]  LAST APPOINTMENT DATE:  Please schedule appointment if longer than 1 year  NEXT APPOINTMENT DATE:  MEDICATION:amLODipine (NORVASC) 2.5 MG tablet   Is the patient out of medication?   [facility] [phone] - [place] [address],   Let patient know to contact pharmacy at the end of the day to make sure medication is ready.  Please notify patient to allow 48-72 hours to process  CLINICAL FILLS OUT ALL BELOW:   LAST REFILL:  QTY:  REFILL DATE:    OTHER COMMENTS:    Okay for refill?  Please advise

## 2021-11-25 ENCOUNTER — Other Ambulatory Visit: Payer: Self-pay | Admitting: Family Medicine

## 2021-11-25 DIAGNOSIS — I10 Essential (primary) hypertension: Secondary | ICD-10-CM

## 2021-11-25 DIAGNOSIS — M810 Age-related osteoporosis without current pathological fracture: Secondary | ICD-10-CM

## 2021-11-30 ENCOUNTER — Other Ambulatory Visit: Payer: BC Managed Care – PPO

## 2021-12-04 ENCOUNTER — Encounter: Payer: BC Managed Care – PPO | Admitting: Family Medicine

## 2021-12-06 ENCOUNTER — Other Ambulatory Visit (INDEPENDENT_AMBULATORY_CARE_PROVIDER_SITE_OTHER): Payer: BC Managed Care – PPO

## 2021-12-06 DIAGNOSIS — I10 Essential (primary) hypertension: Secondary | ICD-10-CM

## 2021-12-06 DIAGNOSIS — M810 Age-related osteoporosis without current pathological fracture: Secondary | ICD-10-CM | POA: Diagnosis not present

## 2021-12-06 LAB — COMPREHENSIVE METABOLIC PANEL
ALT: 15 U/L (ref 0–35)
AST: 18 U/L (ref 0–37)
Albumin: 4.3 g/dL (ref 3.5–5.2)
Alkaline Phosphatase: 43 U/L (ref 39–117)
BUN: 12 mg/dL (ref 6–23)
CO2: 29 mEq/L (ref 19–32)
Calcium: 9.8 mg/dL (ref 8.4–10.5)
Chloride: 104 mEq/L (ref 96–112)
Creatinine, Ser: 0.84 mg/dL (ref 0.40–1.20)
GFR: 75.15 mL/min (ref >60.00)
Glucose, Bld: 90 mg/dL (ref 70–99)
Potassium: 4 mEq/L (ref 3.5–5.1)
Sodium: 141 mEq/L (ref 135–145)
Total Bilirubin: 0.5 mg/dL (ref 0.2–1.2)
Total Protein: 7.2 g/dL (ref 6.0–8.3)

## 2021-12-06 LAB — VITAMIN D 25 HYDROXY (VIT D DEFICIENCY, FRACTURES): VITD: 59.66 ng/mL (ref 30.00–100.00)

## 2021-12-06 LAB — LIPID PANEL
Cholesterol: 195 mg/dL (ref 0–200)
HDL: 53.7 mg/dL (ref >39.00)
LDL Cholesterol: 104 mg/dL — ABNORMAL HIGH (ref 0–99)
NonHDL: 141.09
Total CHOL/HDL Ratio: 4
Triglycerides: 186 mg/dL — ABNORMAL HIGH (ref 0.0–149.0)
VLDL: 37.2 mg/dL (ref 0.0–40.0)

## 2021-12-11 ENCOUNTER — Ambulatory Visit (INDEPENDENT_AMBULATORY_CARE_PROVIDER_SITE_OTHER): Payer: BC Managed Care – PPO | Admitting: Family Medicine

## 2021-12-11 ENCOUNTER — Encounter: Payer: Self-pay | Admitting: Family Medicine

## 2021-12-11 VITALS — BP 132/80 | HR 84 | Temp 97.9°F | Ht 61.5 in | Wt 167.0 lb

## 2021-12-11 DIAGNOSIS — Z0000 Encounter for general adult medical examination without abnormal findings: Secondary | ICD-10-CM | POA: Diagnosis not present

## 2021-12-11 DIAGNOSIS — I10 Essential (primary) hypertension: Secondary | ICD-10-CM

## 2021-12-11 DIAGNOSIS — Z7189 Other specified counseling: Secondary | ICD-10-CM

## 2021-12-11 DIAGNOSIS — M810 Age-related osteoporosis without current pathological fracture: Secondary | ICD-10-CM

## 2021-12-11 NOTE — Patient Instructions (Addendum)
If you can't get scheduled for the bone density test then let me know.   Take care.  Glad to see you. I would get a flu shot each fall.   Update me as needed.

## 2021-12-11 NOTE — Progress Notes (Unsigned)
CPE- See plan.  Routine anticipatory guidance given to patient.  See health maintenance.  The possibility exists that previously documented standard health maintenance information may have been brought forward from a previous encounter into this note.  If needed, that same information has been updated to reflect the current situation based on today's encounter.    Tetanus discussed with patient.  Unclear about previous date.  Encouraged. Flu shot encouraged for the fall  covid vaccine d/w pt. encouraged. Shingles shot due at 60. Pneumonia shot due at 33 Living will discussed with patient.  Daughters Mel Almond and Benjie Karvonen equally designated if patient were incapacitated. Mammogram and Pap smear through gynecology. Bone density test ordered 2023.   Colon cancer screening through Dr. Earlean Shawl 2020.  Osteoporosis.  She saw [facility].  D/w pt.  She opted for    Hypertension:    Using medication without problems or lightheadedness: yes Chest pain with exertion:no Edema:no Short of breath:no Labs d/w pt.   Usually 110s/70s.   PMH and SH reviewed  Meds, vitals, and allergies reviewed.   ROS: Per HPI.  Unless specifically indicated otherwise in HPI, the patient denies:  General: fever. Eyes: acute vision changes ENT: sore throat Cardiovascular: chest pain Respiratory: SOB GI: vomiting GU: dysuria Musculoskeletal: acute back pain Derm: acute rash Neuro: acute motor dysfunction Psych: worsening mood Endocrine: polydipsia Heme: bleeding Allergy: hayfever  GEN: nad, alert and oriented HEENT: mucous membranes moist NECK: supple w/o LA CV: rrr. PULM: ctab, no inc wob ABD: soft, +bs EXT: no edema SKIN: no acute rash

## 2021-12-12 NOTE — Assessment & Plan Note (Signed)
Living will discussed with patient.  Daughters Mel Almond and Benjie Karvonen equally designated if patient were incapacitated.

## 2021-12-12 NOTE — Assessment & Plan Note (Signed)
Tetanus discussed with patient.  Unclear about previous date.  Encouraged. Flu shot encouraged for the fall  covid vaccine d/w pt. encouraged. Shingles shot d/w pt.   Pneumonia shot due at 34 Living will discussed with patient.  Daughters Mel Almond and Benjie Karvonen equally designated if patient were incapacitated. Mammogram and Pap smear through gynecology. Bone density test ordered 2023.   Colon cancer screening through Dr. Earlean Shawl 2020.

## 2021-12-12 NOTE — Assessment & Plan Note (Signed)
Osteoporosis.  She saw [facility].  D/w pt.  She opted for increased calcium intake and recheck DEXA scan.  Ordered.

## 2021-12-12 NOTE — Assessment & Plan Note (Signed)
Continue work on diet and exercise.  Continue amlodipine.

## 2021-12-19 DIAGNOSIS — Z6830 Body mass index (BMI) 30.0-30.9, adult: Secondary | ICD-10-CM | POA: Diagnosis not present

## 2021-12-19 DIAGNOSIS — Z01419 Encounter for gynecological examination (general) (routine) without abnormal findings: Secondary | ICD-10-CM | POA: Diagnosis not present

## 2022-06-19 DIAGNOSIS — D225 Melanocytic nevi of trunk: Secondary | ICD-10-CM | POA: Diagnosis not present

## 2022-06-19 DIAGNOSIS — D2272 Melanocytic nevi of left lower limb, including hip: Secondary | ICD-10-CM | POA: Diagnosis not present

## 2022-06-19 DIAGNOSIS — D2262 Melanocytic nevi of left upper limb, including shoulder: Secondary | ICD-10-CM | POA: Diagnosis not present

## 2022-06-19 DIAGNOSIS — D2261 Melanocytic nevi of right upper limb, including shoulder: Secondary | ICD-10-CM | POA: Diagnosis not present

## 2022-07-05 ENCOUNTER — Other Ambulatory Visit: Payer: Self-pay | Admitting: Obstetrics and Gynecology

## 2022-07-05 DIAGNOSIS — Z1231 Encounter for screening mammogram for malignant neoplasm of breast: Secondary | ICD-10-CM

## 2022-09-18 ENCOUNTER — Ambulatory Visit
Admission: RE | Admit: 2022-09-18 | Discharge: 2022-09-18 | Disposition: A | Discharge status: Home / Self Care | Payer: BC Managed Care – PPO | Source: Ambulatory Visit | Attending: Obstetrics and Gynecology | Admitting: Obstetrics and Gynecology

## 2022-09-18 DIAGNOSIS — Z1231 Encounter for screening mammogram for malignant neoplasm of breast: Secondary | ICD-10-CM | POA: Diagnosis not present

## 2022-10-29 ENCOUNTER — Ambulatory Visit
Admission: RE | Admit: 2022-10-29 | Discharge: 2022-10-29 | Disposition: A | Discharge status: Home / Self Care | Payer: BC Managed Care – PPO | Source: Ambulatory Visit | Attending: Family Medicine | Admitting: Family Medicine

## 2022-10-29 DIAGNOSIS — M8588 Other specified disorders of bone density and structure, other site: Secondary | ICD-10-CM | POA: Diagnosis not present

## 2022-10-29 DIAGNOSIS — M810 Age-related osteoporosis without current pathological fracture: Secondary | ICD-10-CM

## 2022-10-29 DIAGNOSIS — N958 Other specified menopausal and perimenopausal disorders: Secondary | ICD-10-CM | POA: Diagnosis not present

## 2022-10-29 DIAGNOSIS — E349 Endocrine disorder, unspecified: Secondary | ICD-10-CM | POA: Diagnosis not present

## 2022-11-06 DIAGNOSIS — M810 Age-related osteoporosis without current pathological fracture: Secondary | ICD-10-CM | POA: Diagnosis not present

## 2022-11-07 ENCOUNTER — Ambulatory Visit: Payer: BC Managed Care – PPO | Admitting: Family Medicine

## 2022-11-13 ENCOUNTER — Telehealth: Payer: Self-pay | Admitting: Family Medicine

## 2022-11-13 NOTE — Telephone Encounter (Signed)
Patient called in returning call she received. 

## 2022-11-13 NOTE — Telephone Encounter (Signed)
Pt called asking if we received pictures of the Bone density scan from the breast center? If so, can It be sent to Dr. Sharol Roussel office? Fax # is [phone]. Pt states Dr. Valarie Cones is her Endocrinologist with [facility]. Pt also requested a call back to discuss a 24 hr urine sample she's suppose to be doing. Call back # [phone]

## 2022-11-13 NOTE — Telephone Encounter (Signed)
LMTCB

## 2022-11-14 ENCOUNTER — Encounter: Payer: Self-pay | Admitting: Family Medicine

## 2022-11-14 ENCOUNTER — Ambulatory Visit: Payer: BC Managed Care – PPO | Admitting: Family Medicine

## 2022-11-14 VITALS — BP 122/78 | HR 86 | Temp 97.2°F | Ht 61.5 in | Wt 169.1 lb

## 2022-11-14 DIAGNOSIS — M810 Age-related osteoporosis without current pathological fracture: Secondary | ICD-10-CM

## 2022-11-14 DIAGNOSIS — Z7189 Other specified counseling: Secondary | ICD-10-CM

## 2022-11-14 DIAGNOSIS — Z0000 Encounter for general adult medical examination without abnormal findings: Secondary | ICD-10-CM

## 2022-11-14 DIAGNOSIS — I10 Essential (primary) hypertension: Secondary | ICD-10-CM

## 2022-11-14 MED ORDER — AMLODIPINE BESYLATE 2.5 MG PO TABS: 2.5000 mg | ORAL_TABLET | Freq: Every day | ORAL | 3 refills | Status: DC

## 2022-11-14 NOTE — Progress Notes (Signed)
CPE- See plan.  Routine anticipatory guidance given to patient.  See health maintenance.  The possibility exists that previously documented standard health maintenance information may have been brought forward from a previous encounter into this note.  If needed, that same information has been updated to reflect the current situation based on today's encounter.   Tetanus discussed with patient.  Unclear about previous date.  Encouraged. Flu shot encouraged for the fall  covid vaccine d/w pt. encouraged. Shingles shot d/w pt.   Pneumonia shot due at 86 Living will discussed with patient.  Daughters Fredric Mare and Carlena Sax equally designated if patient were incapacitated. Mammogram and Pap smear through gynecology. Bone density test 2024 Colon cancer screening through Dr. Kinnie Scales 2020.  Hypertension:    Using medication without problems or lightheadedness: Yes Chest pain with exertion: No Edema: No Short of breath: No  Osteoporosis d/w pt.  T score -4.4. Prev was -4.  She is following up with [facility].  I'll defer.    PMH and SH reviewed  Meds, vitals, and allergies reviewed.   ROS: Per HPI.  Unless specifically indicated otherwise in HPI, the patient denies:  General: fever. Eyes: acute vision changes ENT: sore throat Cardiovascular: chest pain Respiratory: SOB GI: vomiting GU: dysuria Musculoskeletal: acute back pain Derm: acute rash Neuro: acute motor dysfunction Psych: worsening mood Endocrine: polydipsia Heme: bleeding Allergy: hayfever  GEN: nad, alert and oriented HEENT: ncat NECK: supple w/o LA CV: rrr. PULM: ctab, no inc wob ABD: soft, +bs EXT: no edema SKIN: no acute rash

## 2022-11-14 NOTE — Patient Instructions (Addendum)
Fasting lab visit when possible.  Take care.  Glad to see you. Check with your insurance about coverage for nutrition for osteoporosis.  Let me know.

## 2022-11-14 NOTE — Telephone Encounter (Signed)
Called and spoke with patient, will fax over bone density to Dr. Valarie Cones. Patient did not need 24 hr urine collection instructions right now, she will call back if she needs them. She needs to verify if this is what Dr. Valarie Cones is wanting her to do.

## 2022-11-17 NOTE — Assessment & Plan Note (Signed)
Continue amlodipine.  Diet and exercise discussed with patient.

## 2022-11-17 NOTE — Assessment & Plan Note (Signed)
Living will discussed with patient.  Daughters Fredric Mare and Carlena Sax equally designated if patient were incapacitated.

## 2022-11-17 NOTE — Assessment & Plan Note (Signed)
Osteoporosis d/w pt.  T score -4.4. Prev was -4.  She is following up with [facility].  I'll defer.

## 2022-11-17 NOTE — Assessment & Plan Note (Signed)
Tetanus discussed with patient.  Unclear about previous date.  Encouraged. Flu shot encouraged for the fall  covid vaccine d/w pt. encouraged. Shingles shot d/w pt.   Pneumonia shot due at 38 Living will discussed with patient.  Daughters Fredric Mare and Carlena Sax equally designated if patient were incapacitated. Mammogram and Pap smear through gynecology. Bone density test 2024 Colon cancer screening through Dr. Kinnie Scales 2020.

## 2022-11-26 ENCOUNTER — Other Ambulatory Visit (INDEPENDENT_AMBULATORY_CARE_PROVIDER_SITE_OTHER): Payer: BC Managed Care – PPO

## 2022-11-26 DIAGNOSIS — I10 Essential (primary) hypertension: Secondary | ICD-10-CM

## 2022-11-26 DIAGNOSIS — M810 Age-related osteoporosis without current pathological fracture: Secondary | ICD-10-CM

## 2022-11-26 LAB — COMPREHENSIVE METABOLIC PANEL
ALT: 15 U/L (ref 0–35)
AST: 19 U/L (ref 0–37)
Albumin: 4.2 g/dL (ref 3.5–5.2)
Alkaline Phosphatase: 44 U/L (ref 39–117)
BUN: 17 mg/dL (ref 6–23)
CO2: 28 mEq/L (ref 19–32)
Calcium: 9.7 mg/dL (ref 8.4–10.5)
Chloride: 106 mEq/L (ref 96–112)
Creatinine, Ser: 0.78 mg/dL (ref 0.40–1.20)
GFR: 81.58 mL/min (ref >60.00)
Glucose, Bld: 91 mg/dL (ref 70–99)
Potassium: 4 mEq/L (ref 3.5–5.1)
Sodium: 141 mEq/L (ref 135–145)
Total Bilirubin: 0.5 mg/dL (ref 0.2–1.2)
Total Protein: 7 g/dL (ref 6.0–8.3)

## 2022-11-26 LAB — LIPID PANEL
Cholesterol: 213 mg/dL — ABNORMAL HIGH (ref 0–200)
HDL: 58.4 mg/dL (ref >39.00)
LDL Cholesterol: 123 mg/dL — ABNORMAL HIGH (ref 0–99)
NonHDL: 154.67
Total CHOL/HDL Ratio: 4
Triglycerides: 157 mg/dL — ABNORMAL HIGH (ref 0.0–149.0)
VLDL: 31.4 mg/dL (ref 0.0–40.0)

## 2022-11-26 LAB — VITAMIN D 25 HYDROXY (VIT D DEFICIENCY, FRACTURES): VITD: 43.37 ng/mL (ref 30.00–100.00)

## 2022-12-01 DIAGNOSIS — M810 Age-related osteoporosis without current pathological fracture: Secondary | ICD-10-CM | POA: Diagnosis not present

## 2022-12-04 ENCOUNTER — Other Ambulatory Visit: Payer: Self-pay | Admitting: Endocrinology

## 2022-12-04 DIAGNOSIS — M810 Age-related osteoporosis without current pathological fracture: Secondary | ICD-10-CM

## 2022-12-10 DIAGNOSIS — M810 Age-related osteoporosis without current pathological fracture: Secondary | ICD-10-CM | POA: Diagnosis not present

## 2022-12-10 DIAGNOSIS — M6281 Muscle weakness (generalized): Secondary | ICD-10-CM | POA: Diagnosis not present

## 2022-12-12 ENCOUNTER — Ambulatory Visit: Payer: BC Managed Care – PPO | Admitting: Family Medicine

## 2022-12-12 ENCOUNTER — Encounter: Payer: Self-pay | Admitting: Family Medicine

## 2022-12-12 VITALS — BP 122/80 | HR 95 | Temp 98.7°F | Ht 61.5 in | Wt 168.0 lb

## 2022-12-12 DIAGNOSIS — R109 Unspecified abdominal pain: Secondary | ICD-10-CM | POA: Diagnosis not present

## 2022-12-12 NOTE — Progress Notes (Addendum)
She had an episode of abd pain after dental visit about 2 years ago, w/o a clear cause.  It passed after a few hours.  Then had dental visit this spring (ie months ago) with abd pain after the visit.  Similar sx.    Extended interval w/o sx.   Then to PT at [facility] this week.  She was anxious about the visit.  She used some bands for exercises.  Then had lunch but then had abd pain and vomited.    She didn't know if she had a latex allergy.  She checked with the dental and PT clinic and they didn't use latex material.   No lip or tongue swelling.  No rash.  No wheeze.  No rash with bandaid use.    Most recent episode was 2 days ago after PT visit.    Diffuse abd pain when present.  O/w between episodes, no FCNAVD.  No blood in stool.  No black stools.    Colonoscopy up to date.   No FH colon cancer.   She has gyn clinic/pap pending.   Meds, vitals, and allergies reviewed.   ROS: Per HPI unless specifically indicated in ROS section   GEN: nad, alert and oriented HEENT: ncat NECK: supple w/o LA CV: rrr.  PULM: ctab, no inc wob ABD: soft, +bs EXT: no edema SKIN: Possible small lipoma on the mid abd wall.  Prev noted for years per patient report.

## 2022-12-12 NOTE — Patient Instructions (Signed)
Reasonable to get your pap done and then monitor for symptoms.  Would be okay to use ginger or similar prior to stressful events.  Take care.  Glad to see you.

## 2022-12-12 NOTE — Assessment & Plan Note (Addendum)
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  3 events over 2 years without interval sx, all after stressful appointments.  No clear indication of latex allergy.  Reassuring exam and hx.   Reasonable to get pap done and then monitor for symptoms.  Would be okay to use ginger or similar prior to stressful events. Prev labs unremarkable and colonoscopy up to date.  Very low likelihood of ominous dx.  D/w pt.  She agrees with plan.

## 2022-12-31 DIAGNOSIS — Z01419 Encounter for gynecological examination (general) (routine) without abnormal findings: Secondary | ICD-10-CM | POA: Diagnosis not present

## 2022-12-31 DIAGNOSIS — Z6831 Body mass index (BMI) 31.0-31.9, adult: Secondary | ICD-10-CM | POA: Diagnosis not present

## 2022-12-31 DIAGNOSIS — Z124 Encounter for screening for malignant neoplasm of cervix: Secondary | ICD-10-CM | POA: Diagnosis not present

## 2022-12-31 LAB — HM PAP SMEAR: HM Pap smear: NEGATIVE

## 2023-05-10 IMAGING — MG MM DIGITAL SCREENING BILAT W/ TOMO AND CAD
8 series · 9 of 24 positions shown · non-contrast
Comparison: Previous exam(s).

CLINICAL DATA: Screening.

EXAM:
DIGITAL SCREENING BILATERAL MAMMOGRAM WITH TOMOSYNTHESIS AND CAD
TECHNIQUE: Bilateral screening digital craniocaudal and mediolateral oblique
mammograms were obtained. Bilateral screening digital breast
tomosynthesis was performed. The images were evaluated with
computer-aided detection.

[R CC synth-2D]
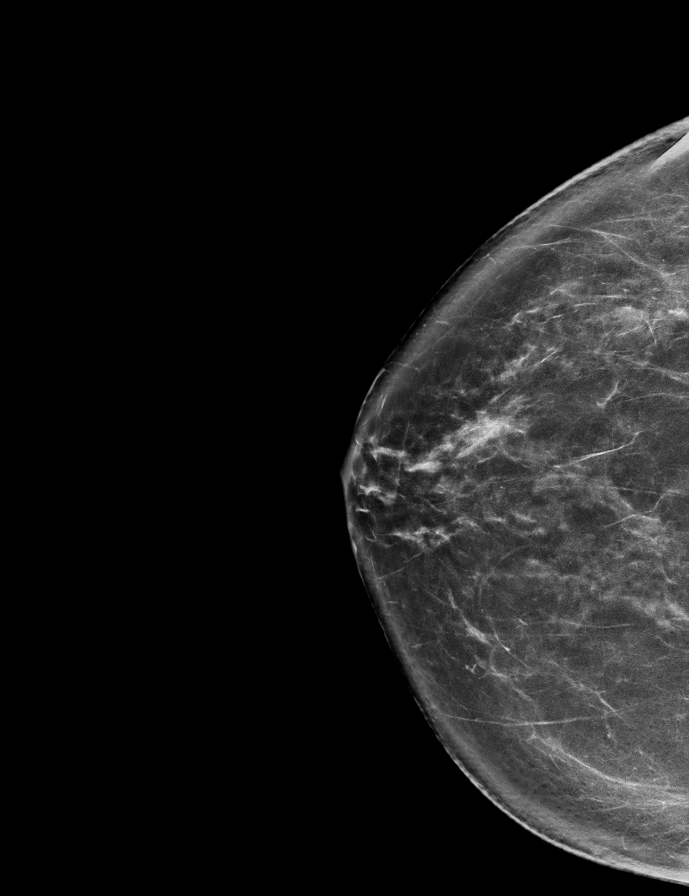

[L MLO synth-2D]
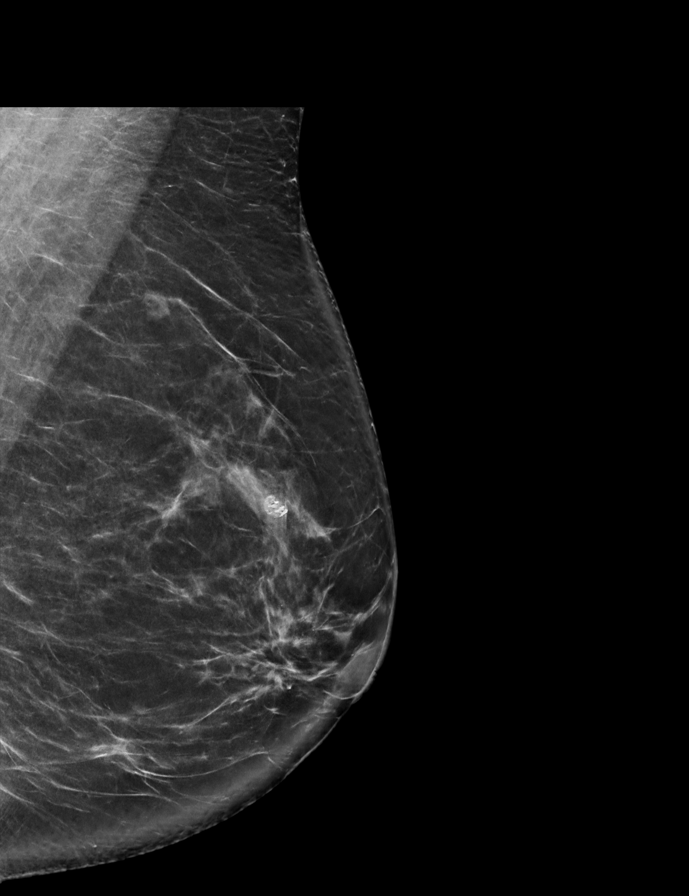

[L CC synth-2D]
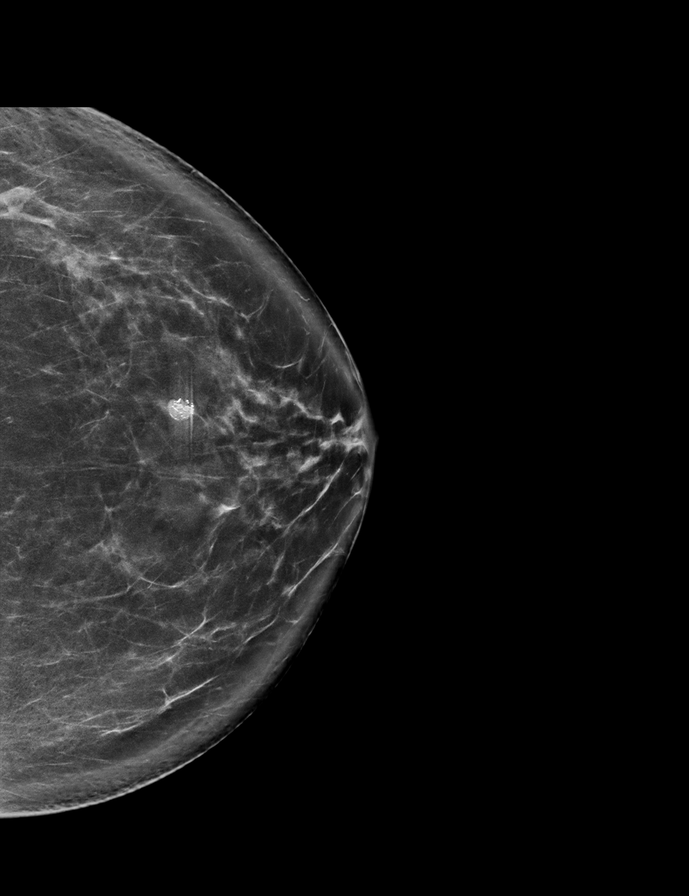

[R MLO synth-2D]
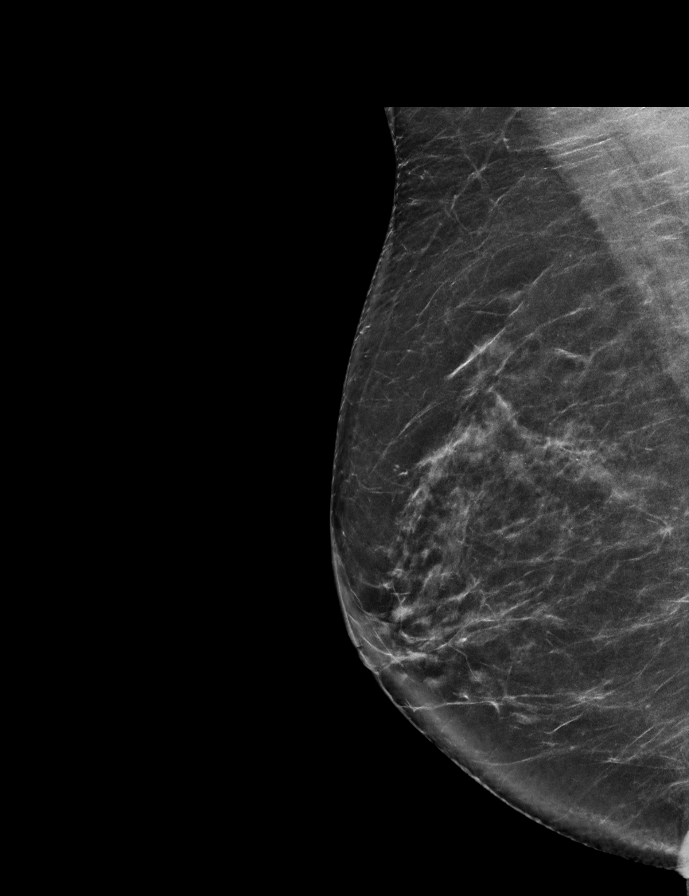

[L CC tomo · 2 of 84 frames shown]
[frame 28/84]
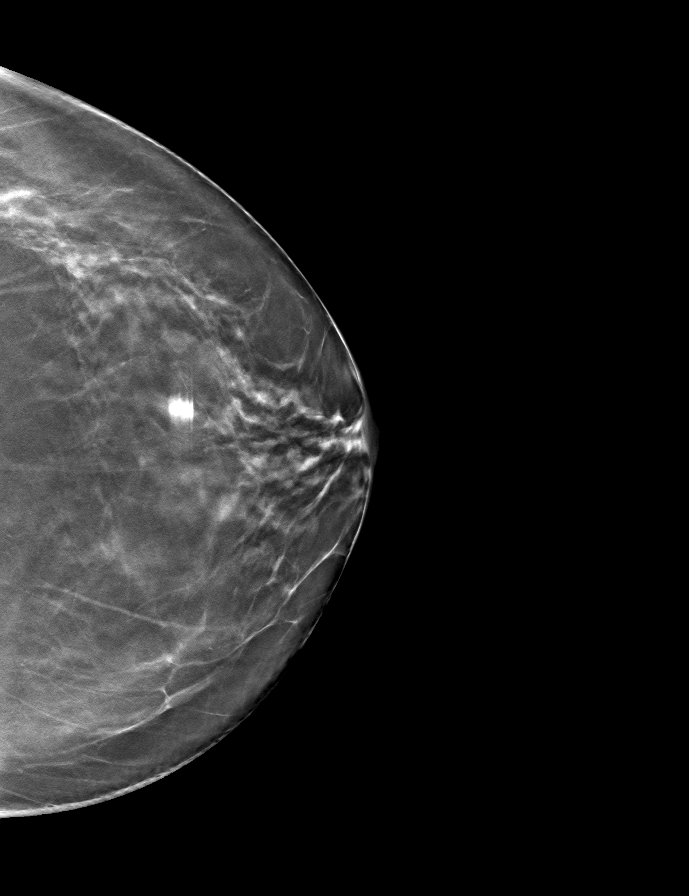
[frame 43/84]
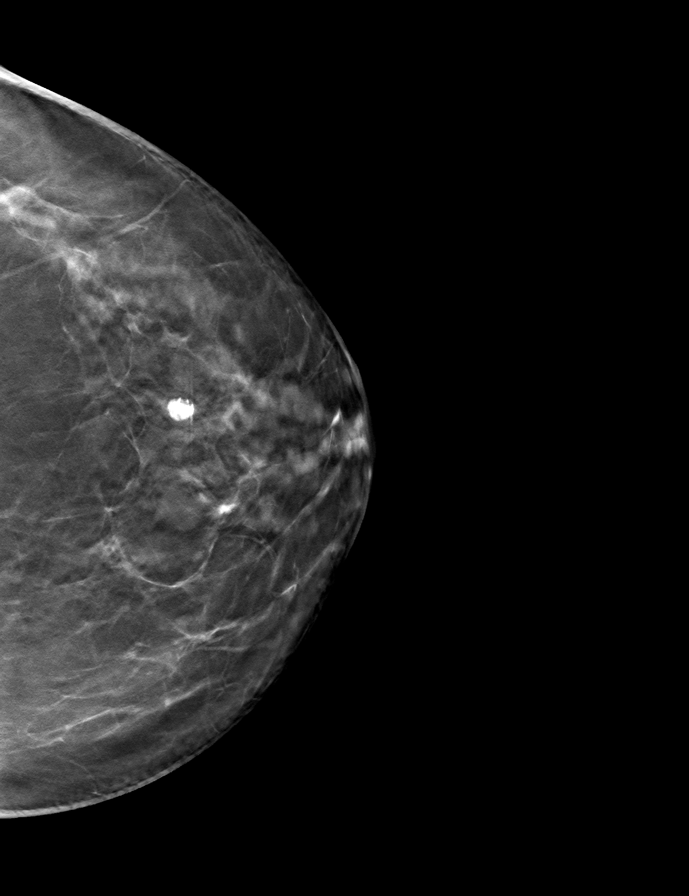

[L MLO tomo · tomo slice 41/82.0]
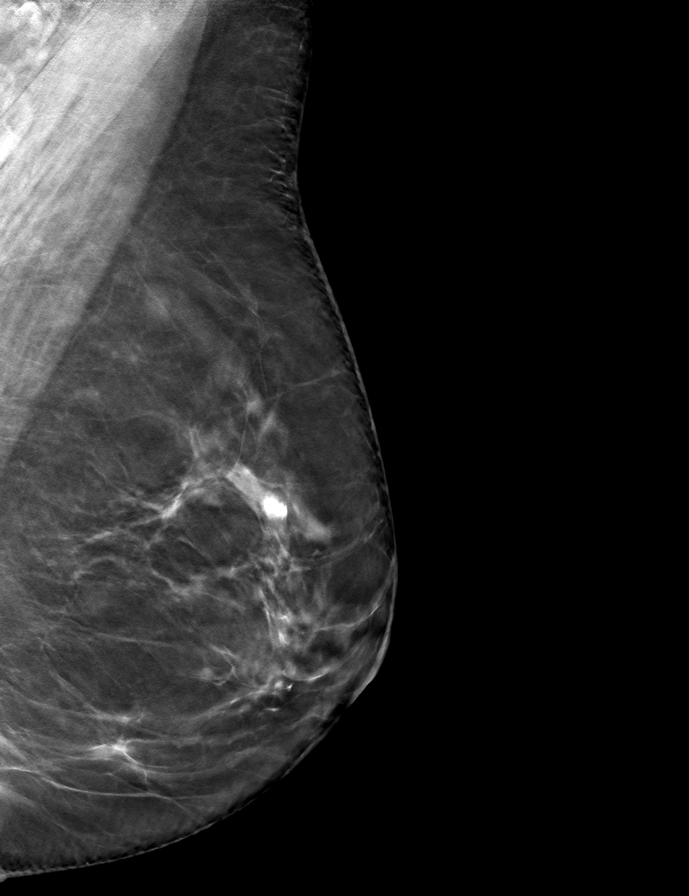

[R MLO tomo · tomo slice 39/78.0]
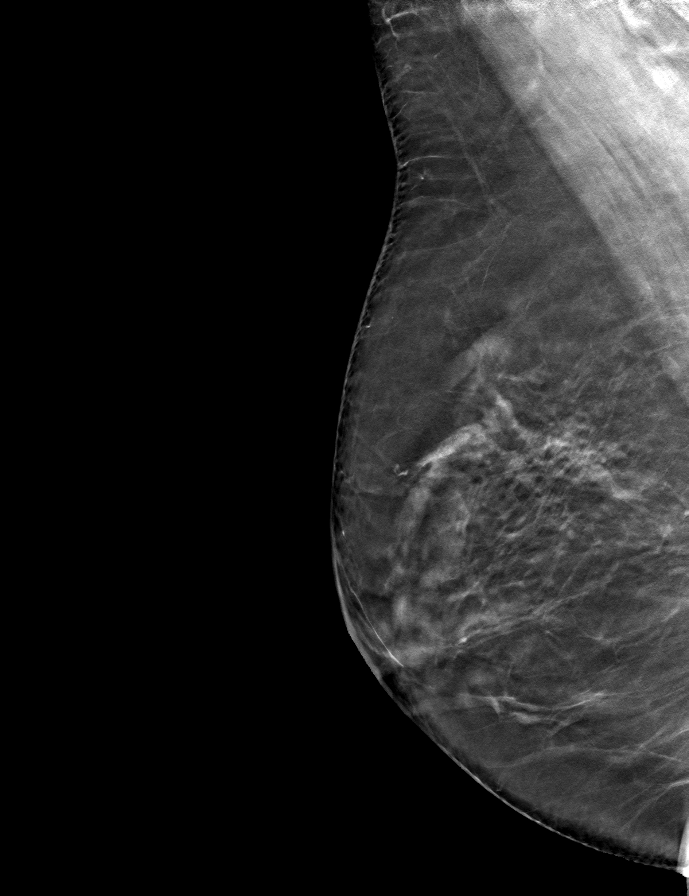

[R CC tomo · tomo slice 41/82.0]
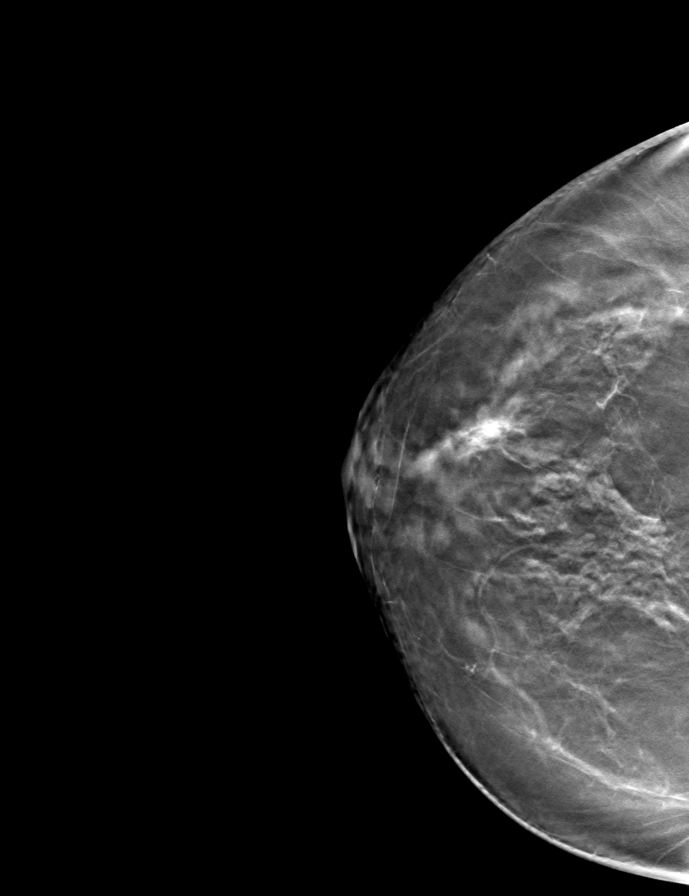

[9 of 24 positions shown; findings below may reference images not displayed]

ACR Breast Density Category b: There are scattered areas of
fibroglandular density.
FINDINGS: There are no findings suspicious for malignancy.
IMPRESSION: No mammographic evidence of malignancy. A result letter of this
screening mammogram will be mailed directly to the patient.

RECOMMENDATION:
Screening mammogram in one year. (Code:51-O-LD2)

BI-RADS CATEGORY  1: Negative.

## 2023-06-25 DIAGNOSIS — D225 Melanocytic nevi of trunk: Secondary | ICD-10-CM | POA: Diagnosis not present

## 2023-06-25 DIAGNOSIS — D2261 Melanocytic nevi of right upper limb, including shoulder: Secondary | ICD-10-CM | POA: Diagnosis not present

## 2023-06-25 DIAGNOSIS — D2272 Melanocytic nevi of left lower limb, including hip: Secondary | ICD-10-CM | POA: Diagnosis not present

## 2023-06-25 DIAGNOSIS — D2262 Melanocytic nevi of left upper limb, including shoulder: Secondary | ICD-10-CM | POA: Diagnosis not present

## 2023-08-06 ENCOUNTER — Other Ambulatory Visit: Payer: Self-pay | Admitting: Obstetrics and Gynecology

## 2023-08-06 DIAGNOSIS — Z1231 Encounter for screening mammogram for malignant neoplasm of breast: Secondary | ICD-10-CM

## 2023-09-16 ENCOUNTER — Telehealth: Payer: Self-pay | Admitting: Family Medicine

## 2023-09-16 NOTE — Telephone Encounter (Signed)
 Copied from CRM [id number]. Topic: Referral - Question >> Sep 16, 2023  9:44 AM Viola F wrote: Reason for CRM: Patient would like to know who the nutritionist is that Dr. Cleatus was going to refer her too? She wasn't sure if they were at the office or elsewhere. Please call her at [phone] (M)

## 2023-09-17 NOTE — Telephone Encounter (Signed)
 Spoke with patient and gave her the information below. Patient wants to do some research into the first and will call back with decision on if she wants to proceed with the referral or not.

## 2023-09-17 NOTE — Telephone Encounter (Signed)
 It would be the nutrition department near [facility].  Does she want to proceed?  Please let me know.  Thanks.

## 2023-09-24 ENCOUNTER — Ambulatory Visit

## 2023-09-25 ENCOUNTER — Ambulatory Visit

## 2023-09-26 ENCOUNTER — Ambulatory Visit
Admission: RE | Admit: 2023-09-26 | Discharge: 2023-09-26 | Disposition: A | Discharge status: Home / Self Care | Source: Ambulatory Visit | Attending: Obstetrics and Gynecology | Admitting: Obstetrics and Gynecology

## 2023-09-26 DIAGNOSIS — Z1231 Encounter for screening mammogram for malignant neoplasm of breast: Secondary | ICD-10-CM | POA: Diagnosis not present

## 2023-10-30 ENCOUNTER — Other Ambulatory Visit: Payer: BC Managed Care – PPO

## 2023-11-14 ENCOUNTER — Other Ambulatory Visit: Payer: Self-pay | Admitting: Family Medicine

## 2023-11-25 DIAGNOSIS — N950 Postmenopausal bleeding: Secondary | ICD-10-CM | POA: Diagnosis not present

## 2023-11-25 DIAGNOSIS — N760 Acute vaginitis: Secondary | ICD-10-CM | POA: Diagnosis not present

## 2023-12-16 DIAGNOSIS — N950 Postmenopausal bleeding: Secondary | ICD-10-CM | POA: Diagnosis not present

## 2023-12-25 ENCOUNTER — Telehealth: Payer: Self-pay | Admitting: *Deleted

## 2023-12-25 DIAGNOSIS — M810 Age-related osteoporosis without current pathological fracture: Secondary | ICD-10-CM

## 2023-12-25 DIAGNOSIS — I10 Essential (primary) hypertension: Secondary | ICD-10-CM

## 2023-12-25 NOTE — Telephone Encounter (Signed)
 CPE on 01/20/24.

## 2023-12-25 NOTE — Telephone Encounter (Signed)
 She could restart the day after the procedure unless she is lightheaded or unless the gyn clinic advises otherwise.  Thanks.

## 2023-12-25 NOTE — Telephone Encounter (Signed)
 Copied from CRM [id number]. Topic: Clinical - Medical Advice >> Dec 24, 2023  9:21 AM Frederich PARAS wrote: Reason for CRM: pt would like a callback in regards to a issue she is having, she wants reassurance in regards to her procedure she is having soon. Please reach out to pt.callback #  [phone]

## 2023-12-25 NOTE — Telephone Encounter (Signed)
Spoke with pt relaying Dr. Josefine Class message.  Pt verbalizes understanding.

## 2023-12-25 NOTE — Telephone Encounter (Signed)
 Copied from CRM [id number]. Topic: Appointments - Scheduling Inquiry for Clinic >> Dec 24, 2023  9:14 AM Frederich PARAS wrote: Reason for CRM: pt called in she would like to schedule her labs, please have pcp put orders in to pt can be scheduled.

## 2023-12-25 NOTE — Telephone Encounter (Signed)
 Returned pt's call. Pt is concerned about when to resume BP meds. States she's scheduled for GYN procedure on 01/14/24 and was told not to eat/drink anything after midnight the night before. Says she usually takes BP med in the mornings. So she is wondering does she take it later that day after procedure or should she just wait until the next morning to take it. Pls advise.

## 2023-12-28 NOTE — Telephone Encounter (Signed)
 I put in the routine orders.  I just saw a notice about preop evaluation.  She has surgery scheduled for 01/14/2024.  I cannot comment on her preop status without having seen the patient.  If they are going to need my input, then she needs to be seen well before her surgery.  Please update gynecology.  See hardcopy form.

## 2023-12-28 NOTE — Addendum Note (Signed)
 Addended by: CLEATUS ARLYSS RAMAN on: 12/28/2023 08:46 PM   Modules accepted: Orders

## 2023-12-29 NOTE — Telephone Encounter (Signed)
 Called patient reviewed information we have set up visit on 11/4 to review with PCP.

## 2023-12-30 ENCOUNTER — Ambulatory Visit: Payer: Self-pay

## 2023-12-30 NOTE — Telephone Encounter (Signed)
 Pt asking if she can take her 1000 dose of amlodipine , pt takes 2.5 mg tablet once a day.   Copied from CRM [id number]. Topic: Clinical - Medical Advice >> Dec 30, 2023  2:08 PM Mesmerise C wrote: Reason for CRM: Patient takes her amLODipine  (NORVASC ) 2.5 MG tablet at 10am everyday patient inquiring if it's too late to take it or can still take it even if off schedule

## 2023-12-31 NOTE — Telephone Encounter (Signed)
 Spoke with pt about missed amlodipine  dose. States she actually spoke with a [facility] nurse yesterday and was advised but expresses her thanks for the call back. Nothing else needed at this time.

## 2024-01-06 ENCOUNTER — Encounter: Payer: Self-pay | Admitting: Family Medicine

## 2024-01-06 ENCOUNTER — Ambulatory Visit: Admitting: Family Medicine

## 2024-01-06 VITALS — BP 138/78 | HR 76 | Temp 97.9°F | Ht 61.5 in | Wt 164.0 lb

## 2024-01-06 DIAGNOSIS — M810 Age-related osteoporosis without current pathological fracture: Secondary | ICD-10-CM

## 2024-01-06 DIAGNOSIS — I10 Essential (primary) hypertension: Secondary | ICD-10-CM | POA: Diagnosis not present

## 2024-01-06 DIAGNOSIS — N939 Abnormal uterine and vaginal bleeding, unspecified: Secondary | ICD-10-CM | POA: Diagnosis not present

## 2024-01-06 LAB — LIPID PANEL
Cholesterol: 222 mg/dL — ABNORMAL HIGH (ref 0–200)
HDL: 51.6 mg/dL (ref >39.00)
LDL Cholesterol: 123 mg/dL — ABNORMAL HIGH (ref 0–99)
NonHDL: 170.74
Total CHOL/HDL Ratio: 4
Triglycerides: 239 mg/dL — ABNORMAL HIGH (ref 0.0–149.0)
VLDL: 47.8 mg/dL — ABNORMAL HIGH (ref 0.0–40.0)

## 2024-01-06 LAB — COMPREHENSIVE METABOLIC PANEL WITH GFR
ALT: 15 U/L (ref 0–35)
AST: 19 U/L (ref 0–37)
Albumin: 4.6 g/dL (ref 3.5–5.2)
Alkaline Phosphatase: 50 U/L (ref 39–117)
BUN: 16 mg/dL (ref 6–23)
CO2: 29 meq/L (ref 19–32)
Calcium: 9.9 mg/dL (ref 8.4–10.5)
Chloride: 101 meq/L (ref 96–112)
Creatinine, Ser: 0.73 mg/dL (ref 0.40–1.20)
GFR: 87.64 mL/min (ref >60.00)
Glucose, Bld: 101 mg/dL — ABNORMAL HIGH (ref 70–99)
Potassium: 3.9 meq/L (ref 3.5–5.1)
Sodium: 138 meq/L (ref 135–145)
Total Bilirubin: 0.4 mg/dL (ref 0.2–1.2)
Total Protein: 7.6 g/dL (ref 6.0–8.3)

## 2024-01-06 LAB — CBC WITH DIFFERENTIAL/PLATELET
Basophils Absolute: 0 K/uL (ref 0.0–0.1)
Basophils Relative: 0.4 % (ref 0.0–3.0)
Eosinophils Absolute: 0.1 K/uL (ref 0.0–0.7)
Eosinophils Relative: 1 % (ref 0.0–5.0)
HCT: 42.5 % (ref 36.0–46.0)
Hemoglobin: 14.5 g/dL (ref 12.0–15.0)
Lymphocytes Relative: 32.9 % (ref 12.0–46.0)
Lymphs Abs: 2.1 K/uL (ref 0.7–4.0)
MCHC: 34.1 g/dL (ref 30.0–36.0)
MCV: 87.4 fl (ref 78.0–100.0)
Monocytes Absolute: 0.5 K/uL (ref 0.1–1.0)
Monocytes Relative: 7.6 % (ref 3.0–12.0)
Neutro Abs: 3.7 K/uL (ref 1.4–7.7)
Neutrophils Relative %: 58.1 % (ref 43.0–77.0)
Platelets: 262 K/uL (ref 150.0–400.0)
RBC: 4.87 Mil/uL (ref 3.87–5.11)
RDW: 12.7 % (ref 11.5–15.5)
WBC: 6.4 K/uL (ref 4.0–10.5)

## 2024-01-06 NOTE — Progress Notes (Unsigned)
 Hypertension:    Using medication without problems or lightheadedness: yes Chest pain with exertion:no Edema:no Short of breath:no Labs pending.  Nonfasting.   She is walking 3 miles at a time for exercise.    D&C pending.  She had VB and GYN eval. D/w pt about office vs surgery clinic tx.  She has OV with GYN tomorrow to discuss site options.    Meds, vitals, and allergies reviewed.   PMH and SH reviewed  ROS: Per HPI unless specifically indicated in ROS section   GEN: nad, alert and oriented HEENT: mucous membranes moist NECK: supple w/o LA CV: rrr. PULM: ctab, no inc wob ABD: soft, +bs EXT: no edema SKIN: no acute rash

## 2024-01-06 NOTE — Patient Instructions (Signed)
 Go to the lab on the way out.   If you have mychart we'll likely use that to update you.    Take care.  Glad to see you. Assuming your labs are fine, then it would appear that you are appropriately low risk for the procedure.

## 2024-01-07 ENCOUNTER — Ambulatory Visit: Payer: Self-pay | Admitting: Family Medicine

## 2024-01-07 DIAGNOSIS — Z6830 Body mass index (BMI) 30.0-30.9, adult: Secondary | ICD-10-CM | POA: Diagnosis not present

## 2024-01-07 DIAGNOSIS — Z01419 Encounter for gynecological examination (general) (routine) without abnormal findings: Secondary | ICD-10-CM | POA: Diagnosis not present

## 2024-01-07 DIAGNOSIS — N939 Abnormal uterine and vaginal bleeding, unspecified: Secondary | ICD-10-CM | POA: Insufficient documentation

## 2024-01-07 LAB — VITAMIN D 25 HYDROXY (VIT D DEFICIENCY, FRACTURES): VITD: 53.9 ng/mL (ref 30.00–100.00)

## 2024-01-07 NOTE — Assessment & Plan Note (Signed)
 With [facility] plan per gynecology. She can discuss site preference with gynecology. See hypertension discussion.  See notes on labs.

## 2024-01-07 NOTE — Assessment & Plan Note (Signed)
 Blood pressure is controlled.  See notes on labs. Assuming her labs are fine, then it would appear that she is appropriately low risk for the procedure.

## 2024-01-16 ENCOUNTER — Other Ambulatory Visit

## 2024-01-20 ENCOUNTER — Ambulatory Visit: Admitting: Family Medicine

## 2024-01-20 ENCOUNTER — Encounter: Payer: Self-pay | Admitting: Family Medicine

## 2024-01-20 VITALS — BP 120/74 | HR 80 | Temp 98.2°F | Ht 61.5 in | Wt 165.2 lb

## 2024-01-20 DIAGNOSIS — I10 Essential (primary) hypertension: Secondary | ICD-10-CM

## 2024-01-20 DIAGNOSIS — Z7189 Other specified counseling: Secondary | ICD-10-CM

## 2024-01-20 DIAGNOSIS — Z0000 Encounter for general adult medical examination without abnormal findings: Secondary | ICD-10-CM | POA: Diagnosis not present

## 2024-01-20 MED ORDER — AMLODIPINE BESYLATE 2.5 MG PO TABS: 2.5000 mg | ORAL_TABLET | Freq: Every day | ORAL | 3 refills | Status: AC

## 2024-01-20 NOTE — Patient Instructions (Signed)
 Take care.  Glad to see you. Update me as needed.  Tetanus shot when possible.  I would get a flu shot each fall.

## 2024-01-20 NOTE — Progress Notes (Unsigned)
 CPE- See plan.  Routine anticipatory guidance given to patient.  See health maintenance.  The possibility exists that previously documented standard health maintenance information may have been brought forward from a previous encounter into this note.  If needed, that same information has been updated to reflect the current situation based on today's encounter.    Tetanus discussed with patient.  Unclear about previous date.  Encouraged. Flu shot encouraged for the fall  covid vaccine d/w pt. encouraged. Shingles shot d/w pt.   Pneumonia shot d/w pt, could be done at 65.   Living will discussed with patient.  Daughters Con and Almeda equally designated if patient were incapacitated. Mammogram 2025 and Pap 12/31/2022 smear through gynecology. Bone density test 2024- she is going to f/u with [facility].   Colon cancer screening through Dr. Luis 2020.  Hypertension:    Using medication without problems or lightheadedness: yes Chest pain with exertion:no Edema:no Short of breath:no Labs d/w pt.   Discussed skipping amlodipine  the day of procedure given the timeline- scheduled in the afternoon.   She is going to have procedure at clinic per gyn on 02/19/24.    PMH and SH reviewed  Meds, vitals, and allergies reviewed.   ROS: Per HPI.  Unless specifically indicated otherwise in HPI, the patient denies:  General: fever. Eyes: acute vision changes ENT: sore throat Cardiovascular: chest pain Respiratory: SOB GI: vomiting GU: dysuria Musculoskeletal: acute back pain Derm: acute rash Neuro: acute motor dysfunction Psych: worsening mood Endocrine: polydipsia Heme: bleeding Allergy: hayfever  GEN: nad, alert and oriented HEENT: mucous membranes moist NECK: supple w/o LA CV: rrr. PULM: ctab, no inc wob ABD: soft, +bs EXT: no edema SKIN: no acute rash

## 2024-01-21 NOTE — Assessment & Plan Note (Signed)
 Labs discussed with patient.  Blood pressure is controlled.  Continue amlodipine  as is.  Continue work on diet and exercise.  See above.

## 2024-01-21 NOTE — Assessment & Plan Note (Signed)
Living will discussed with patient.  Daughters Fredric Mare and Carlena Sax equally designated if patient were incapacitated.

## 2024-01-21 NOTE — Assessment & Plan Note (Signed)
 Tetanus discussed with patient.  Unclear about previous date.  Encouraged. Flu shot encouraged for the fall  covid vaccine d/w pt. encouraged. Shingles shot d/w pt.   Pneumonia shot d/w pt, could be done at 65.   Living will discussed with patient.  Daughters Con and Almeda equally designated if patient were incapacitated. Mammogram 2025 and Pap 12/31/2022 smear through gynecology. Bone density test 2024- she is going to f/u with [facility].   Colon cancer screening through Dr. Luis 2020.
